# Patient Record
Sex: Male | Born: 1940
Health system: Southern US, Community
[De-identification: ages and names within clinical notes are randomized; demographics above are authoritative.]

---

## 1998-05-06 ENCOUNTER — Encounter: Admission: RE | Admit: 1998-05-06 | Discharge: 1998-05-06 | Payer: Self-pay | Admitting: Family Medicine

## 1998-06-03 ENCOUNTER — Ambulatory Visit: Admission: RE | Admit: 1998-06-03 | Discharge: 1998-06-03 | Payer: Self-pay | Admitting: Family Medicine

## 1998-06-06 ENCOUNTER — Encounter: Admission: RE | Admit: 1998-06-06 | Discharge: 1998-06-06 | Payer: Self-pay | Admitting: Family Medicine

## 1998-06-07 ENCOUNTER — Ambulatory Visit (HOSPITAL_COMMUNITY): Admission: RE | Admit: 1998-06-07 | Discharge: 1998-06-07 | Payer: Self-pay | Admitting: Family Medicine

## 2001-11-17 ENCOUNTER — Encounter: Admission: RE | Admit: 2001-11-17 | Discharge: 2001-11-17 | Payer: Self-pay | Admitting: Family Medicine

## 2001-12-14 ENCOUNTER — Encounter: Admission: RE | Admit: 2001-12-14 | Discharge: 2001-12-14 | Payer: Self-pay | Admitting: Family Medicine

## 2002-08-28 ENCOUNTER — Encounter: Admission: RE | Admit: 2002-08-28 | Discharge: 2002-08-28 | Payer: Self-pay | Admitting: Family Medicine

## 2002-09-11 ENCOUNTER — Encounter: Admission: RE | Admit: 2002-09-11 | Discharge: 2002-09-11 | Payer: Self-pay | Admitting: Family Medicine

## 2002-09-26 ENCOUNTER — Encounter: Admission: RE | Admit: 2002-09-26 | Discharge: 2002-09-26 | Payer: Self-pay | Admitting: Family Medicine

## 2003-09-20 ENCOUNTER — Ambulatory Visit (HOSPITAL_COMMUNITY): Admission: RE | Admit: 2003-09-20 | Discharge: 2003-09-21 | Payer: Self-pay | Admitting: Ophthalmology

## 2003-09-25 ENCOUNTER — Encounter: Admission: RE | Admit: 2003-09-25 | Discharge: 2003-09-25 | Payer: Self-pay | Admitting: Sports Medicine

## 2004-09-18 ENCOUNTER — Ambulatory Visit: Payer: Self-pay | Admitting: Family Medicine

## 2005-03-05 ENCOUNTER — Ambulatory Visit: Payer: Self-pay | Admitting: Family Medicine

## 2005-03-10 ENCOUNTER — Ambulatory Visit: Payer: Self-pay | Admitting: Family Medicine

## 2005-04-08 ENCOUNTER — Ambulatory Visit: Payer: Self-pay | Admitting: Family Medicine

## 2005-08-14 ENCOUNTER — Ambulatory Visit: Payer: Self-pay | Admitting: Family Medicine

## 2005-12-17 ENCOUNTER — Ambulatory Visit: Payer: Self-pay | Admitting: Family Medicine

## 2006-01-12 ENCOUNTER — Ambulatory Visit: Payer: Self-pay | Admitting: Family Medicine

## 2006-06-17 DIAGNOSIS — M109 Gout, unspecified: Secondary | ICD-10-CM

## 2006-06-17 DIAGNOSIS — J309 Allergic rhinitis, unspecified: Secondary | ICD-10-CM | POA: Insufficient documentation

## 2006-06-17 DIAGNOSIS — I10 Essential (primary) hypertension: Secondary | ICD-10-CM | POA: Insufficient documentation

## 2006-10-04 ENCOUNTER — Ambulatory Visit: Payer: Self-pay | Admitting: Family Medicine

## 2006-10-04 DIAGNOSIS — H4310 Vitreous hemorrhage, unspecified eye: Secondary | ICD-10-CM

## 2006-10-18 ENCOUNTER — Encounter (INDEPENDENT_AMBULATORY_CARE_PROVIDER_SITE_OTHER): Payer: Self-pay | Admitting: Family Medicine

## 2006-10-18 ENCOUNTER — Ambulatory Visit: Payer: Self-pay | Admitting: Sports Medicine

## 2006-10-18 LAB — CONVERTED CEMR LAB
BUN: 12 mg/dL (ref 6–23)
Calcium: 9.5 mg/dL (ref 8.4–10.5)
Creatinine, Ser: 0.96 mg/dL (ref 0.40–1.50)
HDL: 47 mg/dL (ref >39)
LDL Cholesterol: 98 mg/dL (ref 0–99)
Potassium: 4.5 meq/L (ref 3.5–5.3)

## 2006-10-19 ENCOUNTER — Encounter (INDEPENDENT_AMBULATORY_CARE_PROVIDER_SITE_OTHER): Payer: Self-pay | Admitting: Family Medicine

## 2007-08-22 ENCOUNTER — Encounter (INDEPENDENT_AMBULATORY_CARE_PROVIDER_SITE_OTHER): Payer: Self-pay | Admitting: Family Medicine

## 2007-08-22 ENCOUNTER — Ambulatory Visit: Payer: Self-pay | Admitting: Sports Medicine

## 2007-08-22 LAB — CONVERTED CEMR LAB
BUN: 13 mg/dL (ref 6–23)
Chloride: 103 meq/L (ref 96–112)
Glucose, Bld: 101 mg/dL — ABNORMAL HIGH (ref 70–99)
HDL: 48 mg/dL (ref >39)
LDL Cholesterol: 100 mg/dL — ABNORMAL HIGH (ref 0–99)
Potassium: 4.1 meq/L (ref 3.5–5.3)
Total CHOL/HDL Ratio: 3.7
Triglycerides: 152 mg/dL — ABNORMAL HIGH (ref <150)

## 2007-08-23 ENCOUNTER — Encounter (INDEPENDENT_AMBULATORY_CARE_PROVIDER_SITE_OTHER): Payer: Self-pay | Admitting: Family Medicine

## 2007-09-01 ENCOUNTER — Encounter (INDEPENDENT_AMBULATORY_CARE_PROVIDER_SITE_OTHER): Payer: Self-pay | Admitting: Family Medicine

## 2007-09-01 LAB — CONVERTED CEMR LAB
OCCULT 1: NEGATIVE
OCCULT 3: NEGATIVE

## 2008-08-20 ENCOUNTER — Encounter: Payer: Self-pay | Admitting: *Deleted

## 2008-09-27 ENCOUNTER — Ambulatory Visit: Payer: Self-pay | Admitting: Family Medicine

## 2008-09-27 ENCOUNTER — Encounter (INDEPENDENT_AMBULATORY_CARE_PROVIDER_SITE_OTHER): Payer: Self-pay | Admitting: Family Medicine

## 2008-09-27 DIAGNOSIS — J45909 Unspecified asthma, uncomplicated: Secondary | ICD-10-CM | POA: Insufficient documentation

## 2008-09-27 LAB — CONVERTED CEMR LAB
ALT: 17 units/L (ref 0–53)
Albumin: 4.3 g/dL (ref 3.5–5.2)
Cholesterol: 165 mg/dL (ref 0–200)
Glucose, Bld: 94 mg/dL (ref 70–99)
Sodium: 139 meq/L (ref 135–145)
Total CHOL/HDL Ratio: 3.7
Total Protein: 7.7 g/dL (ref 6.0–8.3)
Uric Acid, Serum: 9 mg/dL — ABNORMAL HIGH (ref 4.0–7.8)
VLDL: 28 mg/dL (ref 0–40)

## 2008-09-28 ENCOUNTER — Encounter (INDEPENDENT_AMBULATORY_CARE_PROVIDER_SITE_OTHER): Payer: Self-pay | Admitting: Family Medicine

## 2009-12-20 ENCOUNTER — Ambulatory Visit: Payer: Self-pay | Admitting: Family Medicine

## 2009-12-20 ENCOUNTER — Encounter: Payer: Self-pay | Admitting: Family Medicine

## 2009-12-20 LAB — CONVERTED CEMR LAB
ALT: 17 units/L (ref 0–53)
Albumin: 4.3 g/dL (ref 3.5–5.2)
Alkaline Phosphatase: 54 units/L (ref 39–117)
CO2: 28 meq/L (ref 19–32)
Chloride: 102 meq/L (ref 96–112)
Cholesterol: 168 mg/dL (ref 0–200)
Creatinine, Ser: 0.96 mg/dL (ref 0.40–1.50)
Glucose, Bld: 105 mg/dL — ABNORMAL HIGH (ref 70–99)
RBC: 5.19 M/uL (ref 4.22–5.81)
Total Bilirubin: 1.2 mg/dL (ref 0.3–1.2)
Total Protein: 7.5 g/dL (ref 6.0–8.3)
Triglycerides: 156 mg/dL — ABNORMAL HIGH (ref <150)

## 2010-05-20 NOTE — Assessment & Plan Note (Signed)
Summary: cpe,df   Vital Signs:  Patient profile:   70 year old male Weight:      138.5 pounds Temp:     98.5 degrees F oral Pulse rate:   86 / minute Pulse rhythm:   regular BP sitting:   155 / 101  (left arm) Cuff size:   regular  Vitals Entered By: Loralee Pacas CMA (December 20, 2009 8:36 AM)                                                                                                                                                                                                                                                                                                                                                                                                                                                                                                                                  Serial Vital Signs/Assessments:  Time      Position  BP  Pulse  Resp  Temp     By                     124/84                         Renold Don MD  CC: cpe Comments pt stated that he was nervous and believes thats why his bp was up    Primary Care Provider:  Sylvan Cheese MD  CC:  cpe.  History of Present Illness: 1.  HTN- taking lisinopril-HCTZ on stable dose now for several years. Check blood pressure at home daily, mostly in 110-120s, very occasional 130 or 140 measurement on days when he does not take medication.  Denies medication side effects.  No CP or leg swelling.  Has  occasional shortness of breath-120s which he attributes to asthma (see below).  Not taking ASA regularly.    2.  "asthma"- has h/o normal PFTs in 2004, no formal documentation of asthma in the past.  Uses ventolin inhaler mostly during allergy season in the fall.  During allergy season he uses inhaler regularly, but currently not having to use inhaler at all.  Former smoker but quit over 30 years ago.    3.  gout- has not had episode recently.  Usually happens only once a year.  Takes advil as needed for pain but infrequently.  Had uric acid level drawn and slightly elevated before.    4.  colon cancer screening- declines colonoscopy   Habits & Providers  Alcohol-Tobacco-Diet     Tobacco Status: quit     Year Quit: 1970  Current Problems (verified): 1)  Asthma, Intermittent, Mild  (ICD-493.90) 2)  Physical Examination  (ICD-V70.0) 3)  Hx of Hemorrhage, Vitreous  (ICD-379.23) 4)  Rhinitis, Allergic  (ICD-477.9) 5)  Hypertension, Benign Systemic  (ICD-401.1) 6)  Gout, Unspecified  (ICD-274.9)  Current Medications (verified): 1)  Albuterol 90 Mcg/act Aers (Albuterol) .... Inhale 1 Puff Every Four To Six Hours 2)  Aspirin 81 Mg Tbec (Aspirin) .... Take 1 Tablet By Mouth Once A Day 3)  Colchicine 0.6 Mg Tabs (Colchicine) .... Take 1 Tablet By Mouth Every Four Hours 4)  Zestoretic 20-25 Mg Tabs (Lisinopril-Hydrochlorothiazide) .... Take 1 Tablet By Mouth Once A Day  Allergies (verified): No Known Drug Allergies  Social History: Smoking Status:  quit  Review of Systems       ROS:  no headaches, pre-syncopal or syncopal episodes, chest pain, palpitations abdominal pain, diarrhea or constipation, melena, hematochezia, lower extremity swelling.    Physical Exam  General:  Vital signs reviewed.  Repeat as above Well-developed, well-nourished patient in NAD.  Awake and cooperative  Head:  normocephalic and atraumatic.   Eyes:  wears glassesp, upils equal, pupils round, and pupils  reactive to light.   Mouth:  Oral mucosa pink and moist Dentition:  dentures Neck:  supple, no thyromegaly, no carotid bruits, and no cervical lymphadenopathy.   Lungs:  clear to auscultation bilaterally without wheezing, rales, or rhonchi.  Normal work of breathing  Heart:  Regular rate and rhythm without murmur, rub, or gallop.  Normal S1/S2  Abdomen:  soft/nondistended/nontender.  Bowel sounds present and normoactive.  No organomegaly noted.   Extremities:  No clubbing, cyanosis, edema, or deformity noted with normal full range of motion of all joints.   Neurologic:  alert & oriented X3 and cranial nerves II-XII intact.   No focal deficits   Impression &  Recommendations:  Problem # 1:  HYPERTENSION, BENIGN SYSTEMIC (ICD-401.1) Assessment Unchanged Currently at goal.  Continue on current meds.   His updated medication list for this problem includes:    Zestoretic 20-25 Mg Tabs (Lisinopril-hydrochlorothiazide) .Marland Kitchen... Take 1 tablet by mouth once a day  Orders: Comp Met-[facility] ([id number]) Lipid-[facility] ([id number])  Problem # 2:  ASTHMA, INTERMITTENT, MILD (ICD-493.90) Will refill.  As noted above, unclear etiology for shortness of breath, PFTs normal in 2004.   His updated medication list for this problem includes:    Albuterol 90 Mcg/act Aers (Albuterol) ..... Inhale 1 puff every four to six hours  Orders: [facility]- Est  Level 4 (01093)  Problem # 3:  GOUT, UNSPECIFIED (ICD-274.9) Assessment: Improved Only about 1 attack per year.  NSAIDs as needed.  Will follow. His updated medication list for this problem includes:    Colchicine 0.6 Mg Tabs (Colchicine) .Marland Kitchen... Take 1 tablet by mouth every four hours  Complete Medication List: 1)  Albuterol 90 Mcg/act Aers (Albuterol) .... Inhale 1 puff every four to six hours 2)  Aspirin 81 Mg Tbec (Aspirin) .... Take 1 tablet by mouth once a day 3)  Colchicine 0.6 Mg Tabs (Colchicine) .... Take 1 tablet by mouth every four hours 4)  Zestoretic  20-25 Mg Tabs (Lisinopril-hydrochlorothiazide) .... Take 1 tablet by mouth once a day  Other Orders: CBC-[facility] (23557)  Patient Instructions: 1)  Your blood pressure is a little high today. 2)  Labs done today- cholesterol, uric acid, and electrolyte panel.   3)  Follow up with your new doctor in 1 year or sooner if you have any problems. 4)  Refills have been sent to your pharmacy. Prescriptions: ZESTORETIC 20-25 MG TABS (LISINOPRIL-HYDROCHLOROTHIAZIDE) Take 1 tablet by mouth once a day  #90 x 3   Entered and Authorized by:   Renold Don MD   Signed by:   Renold Don MD on 12/20/2009   Method used:   Electronically to        [facility] (retail)       [address]       [place], [place]  [address]       Ph: [phone]       Fax: [phone]   RxID:   [id number] ALBUTEROL 90 MCG/ACT AERS (ALBUTEROL) Inhale 1 puff every four to six hours  #1 x 6   Entered and Authorized by:   Renold Don MD   Signed by:   Renold Don MD on 12/20/2009   Method used:   Electronically to        [facility] (retail)       [address]       [place], [place]  [address]       Ph: [phone]       Fax: [phone]   RxID:   [id number]

## 2010-05-30 ENCOUNTER — Encounter: Payer: Self-pay | Admitting: *Deleted

## 2010-05-30 ENCOUNTER — Observation Stay (HOSPITAL_COMMUNITY): Payer: PRIVATE HEALTH INSURANCE

## 2010-05-30 ENCOUNTER — Inpatient Hospital Stay (HOSPITAL_COMMUNITY)
Admission: AD | Admit: 2010-05-30 | Discharge: 2010-05-31 | DRG: 310 | Disposition: A | Discharge status: Home / Self Care | Payer: PRIVATE HEALTH INSURANCE | Source: Ambulatory Visit | Attending: Family Medicine | Admitting: Family Medicine

## 2010-05-30 DIAGNOSIS — R5381 Other malaise: Secondary | ICD-10-CM | POA: Diagnosis present

## 2010-05-30 DIAGNOSIS — R002 Palpitations: Secondary | ICD-10-CM | POA: Diagnosis present

## 2010-05-30 DIAGNOSIS — M109 Gout, unspecified: Secondary | ICD-10-CM | POA: Diagnosis present

## 2010-05-30 DIAGNOSIS — I10 Essential (primary) hypertension: Secondary | ICD-10-CM

## 2010-05-30 DIAGNOSIS — R0602 Shortness of breath: Secondary | ICD-10-CM

## 2010-05-30 DIAGNOSIS — Z7982 Long term (current) use of aspirin: Secondary | ICD-10-CM

## 2010-05-30 DIAGNOSIS — M19019 Primary osteoarthritis, unspecified shoulder: Secondary | ICD-10-CM | POA: Diagnosis present

## 2010-05-30 DIAGNOSIS — Z23 Encounter for immunization: Secondary | ICD-10-CM

## 2010-05-30 DIAGNOSIS — B9789 Other viral agents as the cause of diseases classified elsewhere: Secondary | ICD-10-CM

## 2010-05-30 DIAGNOSIS — I491 Atrial premature depolarization: Principal | ICD-10-CM | POA: Diagnosis present

## 2010-05-30 DIAGNOSIS — J42 Unspecified chronic bronchitis: Secondary | ICD-10-CM

## 2010-05-30 DIAGNOSIS — J449 Chronic obstructive pulmonary disease, unspecified: Secondary | ICD-10-CM | POA: Diagnosis present

## 2010-05-30 DIAGNOSIS — Z87891 Personal history of nicotine dependence: Secondary | ICD-10-CM

## 2010-05-30 DIAGNOSIS — J4489 Other specified chronic obstructive pulmonary disease: Secondary | ICD-10-CM | POA: Diagnosis present

## 2010-05-30 LAB — CARDIAC PANEL(CRET KIN+CKTOT+MB+TROPI)
Relative Index: 2.4 (ref 0.0–2.5)
Troponin I: 0.02 ng/mL (ref 0.00–0.06)

## 2010-05-31 ENCOUNTER — Observation Stay (HOSPITAL_COMMUNITY): Payer: PRIVATE HEALTH INSURANCE

## 2010-05-31 LAB — CARDIAC PANEL(CRET KIN+CKTOT+MB+TROPI)
Relative Index: 2.3 (ref 0.0–2.5)
Relative Index: 2.6 — ABNORMAL HIGH (ref 0.0–2.5)
Total CK: 131 U/L (ref 7–232)
Troponin I: 0.01 ng/mL (ref 0.00–0.06)

## 2010-05-31 LAB — DIFFERENTIAL
Lymphs Abs: 2.4 10*3/uL (ref 0.7–4.0)
Monocytes Absolute: 0.8 10*3/uL (ref 0.1–1.0)
Neutrophils Relative %: 48 % (ref 43–77)

## 2010-05-31 LAB — COMPREHENSIVE METABOLIC PANEL
ALT: 19 U/L (ref 0–53)
Albumin: 3.5 g/dL (ref 3.5–5.2)
CO2: 26 mEq/L (ref 19–32)
Creatinine, Ser: 0.93 mg/dL (ref 0.4–1.5)
GFR calc Af Amer: >60 mL/min (ref >60)
Potassium: 3.5 mEq/L (ref 3.5–5.1)
Sodium: 141 mEq/L (ref 135–145)
Total Protein: 6.8 g/dL (ref 6.0–8.3)

## 2010-05-31 LAB — LIPID PANEL
HDL: 34 mg/dL — ABNORMAL LOW (ref >39)
LDL Cholesterol: 94 mg/dL (ref 0–99)
Total CHOL/HDL Ratio: 4.7 RATIO
VLDL: 33 mg/dL (ref 0–40)

## 2010-05-31 LAB — CBC
Hemoglobin: 14.4 g/dL (ref 13.0–17.0)
MCH: 30.2 pg (ref 26.0–34.0)
RBC: 4.77 MIL/uL (ref 4.22–5.81)
RDW: 12.7 % (ref 11.5–15.5)

## 2010-05-31 MED ORDER — TECHNETIUM TC 99M TETROFOSMIN IV KIT
30.0000 | PACK | Freq: Once | INTRAVENOUS | Status: AC | PRN
  Administered 2010-05-31: 30 via INTRAVENOUS

## 2010-05-31 MED ORDER — TECHNETIUM TC 99M TETROFOSMIN IV KIT
10.0000 | PACK | Freq: Once | INTRAVENOUS | Status: AC | PRN
  Administered 2010-05-31: 10 via INTRAVENOUS

## 2010-06-01 ENCOUNTER — Other Ambulatory Visit (HOSPITAL_COMMUNITY): Payer: PRIVATE HEALTH INSURANCE

## 2010-06-03 NOTE — Consult Note (Signed)
[name], [name]                   ACCOUNT NO.:  [id number]  MEDICAL RECORD NO.:  [id number]           PATIENT TYPE:  I  LOCATION:  3743                         FACILITY:  [facility]  PHYSICIAN:  Armanda Magic, M.D.     DATE OF BIRTH:  [birth year]  DATE OF CONSULTATION:  05/30/2010 DATE OF DISCHARGE:                                CONSULTATION   REFERRING PHYSICIAN:  Dr. Denny Levy.  CHIEF COMPLAINT:  Chest tightness.  HISTORY OF PRESENT ILLNESS:  This is a 70 year old male with known history of hypertension and chronic bronchitis, reported 2-day history of chest tightness accompanied by palpitations.  He says that this past episode started this morning.  Apparently he was lifting a child into a car seat and developed chest pressure in his chest with no radiation and no associated symptoms of shortness of breath, nausea, vomiting or diaphoresis.  He says it last about 13 minutes and resolved.  He does admit to using his albuterol inhaler three times yesterday and one time today with mild relief of chest tightness.  Currently denies any chest pain.  His daughter who is a physician evaluated him at home this morning and felt that his heart rate was abnormal and she took him to the clinic where an EKG shows sinus tachycardia with blocked PACs.  We are now asked to consult.  PAST MEDICAL HISTORY:  Hypertension, chronic bronchitis, gout, and right shoulder arthritis.  ALLERGIES:  None.  MEDICATIONS: 1. Albuterol inhaler 1 puff q.4 h. p.r.n. 2. Lisinopril/HCTZ 20/25 mg daily. 3. Advil p.r.n. 4. Vitamin D. 5. Vitamin E. 6. Calcium supplement.  SOCIAL HISTORY:  He lives at home with his wife.  He is unemployed.  He has a Ph.D. in physics.  He drinks two beers a week.  He use to smoke but quit.  He did not do any regular exercise.  FAMILY HISTORY:  There is no family history of CAD.  REVIEW OF SYSTEMS:  Significant for fatigue for 3-4 days and cough.  PHYSICAL EXAMINATION:  VITAL  SIGNS:  Blood pressure was 112/77, heart rate 112, respirations 20, and O2 saturation 97% on room air. GENERAL:  Well-developed, well-nourished white Asian man in no acute distress. HEENT:  Benign. NECK:  Supple without lymphadenopathy.  Carotid upstrokes are +2 without bruits. LUNGS:  Clear to auscultation throughout. HEART:  Regular rate and rhythm.  No murmurs, rubs, or gallops.  Normal S1 and S2. ABDOMEN:  Soft, nontender, nondistended.  Positive bowel sounds.  No hepatosplenomegaly. EXTREMITIES:  No cyanosis, erythema, or edema.  EKG on admission showed sinus rhythm with frequent PACs and short runs of atrial tachycardia, heart rate 110 beats per minute,  there are no ST- T wave abnormalities.  Labs are pending.  ASSESSMENT: 1. Chest pressure of unclear etiology is cardiac risk factors include     history of hypertension, there is no family history of coronary     artery disease.  EKG is nonischemic. 2. Frequent PACs and short runs of nonsustained atrial tachycardia up     to 5 beats.  Otherwise EKG nonischemic.  The patient  does complain     of a history of palpitations which is most likely related to     underlying PACs. 3. History of chronic bronchitis. 4. History of hypertension.  PLAN: 1. Would start Cardizem 120 mg daily for suppression of PACs.  We will     hold on beta blockers given his history of chronic bronchitis. 2. N.p.o. after midnight. 3. Stress Cardiolite study in the morning to evaluate for underlying     coronary ischemia.     Armanda Magic, M.D.     TT/MEDQ  D:  05/30/2010  T:  05/31/2010  Job:  [id number]  Electronically Signed by Armanda Magic M.D. on 06/03/2010 12:08:57 PM

## 2010-06-17 NOTE — H&P (Signed)
[name], [name]                   ACCOUNT NO.:  [id number]  MEDICAL RECORD NO.:  [id number]           PATIENT TYPE:  I  LOCATION:  3743                         FACILITY:  [facility]  PHYSICIAN:  Fred Ramp, MD        DATE OF BIRTH:  22-Apr-1940  DATE OF ADMISSION:  05/30/2010 DATE OF DISCHARGE:                             HISTORY & PHYSICAL   PRIMARY CARE PHYSICIAN:  Fred Don, MD at [facility]  CHIEF COMPLAINT:  Chest tightness.  HISTORY OF PRESENT ILLNESS:  The patient is a 70 year old male with a known history of hypertension and chronic bronchitis who reports a 2-day history of chest tightness accompanied by palpitations that started this morning.  He admits to using his albuterol inhaler 3 times yesterday and one times this morning with mild relief of tightness.  He denies chest pain, dyspnea on exertion, syncope, nausea, or vomiting, tingling or numbness in extremities and sweating.  His daughter who is a physician evaluated him at home this morning and felt that his heart rate was abnormal/irregular.  She took him to [facility] where an EKG was obtained that showed sinus tachycardia and evidence of premature atrial complexes concerning for either heart block or atrial flutter.  Blood pressure at that time was 110/70.  ALLERGIES:  The patient has no known drug allergies.  PAST MEDICAL HISTORY:  Significant for hypertension controlled, chronic bronchitis, gout, right shoulder arthritis.  MEDICATIONS:  The patient takes albuterol inhaler 1 puff q.4 p.r.n., lisinopril/HCTZ combination 20/25 mg p.o. daily, Advil p.r.n., vitamin D, vitamin E, and calcium supplements.  SOCIAL HISTORY:  The patient lives at home with his wife.  He is unemployed.  He has a Ph.D. in physics.  He drinks about 2 beers per week.  He is a previous smoker who quit in his 68s.  He denies illicit drug use.  He denies regular exercise.  REVIEW OF SYSTEMS:   Pertinent positives include fatigue x4 days accompanied by dry cough.  Pertinent negatives include sneezing fevers, night sweats, weight changes, nausea, vomiting, diarrhea, constipation, and dysuria.  PHYSICAL EXAMINATION:  VITALS:  Temperature 98.5, blood pressure 112/77, heart rate 112, O2 sats 97% on room air. GENERAL:  He is awake, alert in no acute distress.  He is a well groomed elderly male. HEENT:  His head is normocephalic, atraumatic. NECK:  Supple and nontender. CARDIOVASCULAR:  S1, S2, tachy rate, no murmurs, rubs or gallops.  No carotid bruits on auscultation bilaterally. LUNGS:  Normal work of breathing.  Clear to auscultation bilaterally. No wheezing, rales, or rhonchi. ABDOMEN:  Soft, nontender, and nondistended. EXTREMITIES:  No edema and nontender.  LABS AND STUDIES:  EKG obtained May 30, 2010 in clinic, sinus tachycardia, vent rate 110 with blocked premature atrial complexes.  ASSESSMENT AND PLAN:  A 70 year old male with new onset chest tightness and EKG changes concerning for heart block versus atrial flutter. 1. Chest tightness likely cardiac in etiology in the setting of     arrhythmia and normal lung exam.  The plan is to admit to  [facility]     [facility] tele floor, cycle cardiac enzymes q.8     times 3 sets, obtain a chest x-ray, repeat EKG, obtain TSH level,     and call a cardiology consult.  We will also start the patient on     aspirin 81 mg p.o. daily, Zofran 4 mg p.o. q.6 as needed for nausea     and Tylenol 650 mg p.o. q.6 as needed for pain. 2. History of chronic bronchitis.  The patient was is tachy which is     likely in part secondary to increased albuterol use.  Normal lung     exam, so we will hold albuterol for now, supplemental oxygen as     needed. 3. History of hypertension, blood pressure normal.  We will continue     home blood pressure medications. 4. Fluids, electrolytes, nutrition/gastrointestinal.  Regular diet,      BMET, Hep-Lock IV. 5. Prophylaxis.  Heparin 5000 units subcu t.i.d. for deep venous     thrombosis prophylaxis.    ______________________________ Dessa Phi, MD   ______________________________ Fred Ramp, MD    JF/MEDQ  D:  05/30/2010  T:  05/31/2010  Job:  [id number]  cc:   Fred Don, MD  Electronically Signed by Dessa Phi MD on 06/08/2010 11:17:55 AM Electronically Signed by Denny Levy MD on 06/17/2010 04:19:12 PM

## 2010-08-08 ENCOUNTER — Encounter: Payer: Self-pay | Admitting: Family Medicine

## 2010-08-08 ENCOUNTER — Ambulatory Visit (INDEPENDENT_AMBULATORY_CARE_PROVIDER_SITE_OTHER): Payer: PRIVATE HEALTH INSURANCE | Admitting: Family Medicine

## 2010-08-08 DIAGNOSIS — I10 Essential (primary) hypertension: Secondary | ICD-10-CM

## 2010-08-08 DIAGNOSIS — I471 Supraventricular tachycardia, unspecified: Secondary | ICD-10-CM

## 2010-08-08 DIAGNOSIS — J45909 Unspecified asthma, uncomplicated: Secondary | ICD-10-CM

## 2010-08-08 DIAGNOSIS — M109 Gout, unspecified: Secondary | ICD-10-CM

## 2010-08-08 DIAGNOSIS — I4719 Other supraventricular tachycardia: Secondary | ICD-10-CM | POA: Insufficient documentation

## 2010-08-08 MED ORDER — FLUTICASONE-SALMETEROL 100-50 MCG/DOSE IN AEPB: 1.0000 | INHALATION_SPRAY | Freq: Two times a day (BID) | RESPIRATORY_TRACT | Status: DC

## 2010-08-08 NOTE — Progress Notes (Signed)
  Subjective:    Patient ID: [name], male    DOB: 01-25-1941, 70 y.o.   MRN: [id number]  HPI  #1: Gout flare. He says he has these a couple times a year. This one is not very bad, his pain is being alleviated by over-the-counter Motrin. He takes one or 2 pills once or twice a day. The pain is in his right great toe. His main concern is if his gout is destroying the joint. He doesn't really want to take something stronger at this time.  #2: Asthma: He is using his albuterol inhaler 4 times a day right now. His daughter is with him and she wonders if he would be a candidate for going on a long acting inhaler. He would be agreeable to that.  #3: Arrhythmia: He is only taking his medicine once every 2-3 days. He said he's had no episodes of heart palpitations. He denies any chest pains. He denies any shortness of breath, no change in his work capacity. No lower extremity edema. No headache. He says he doesn't want to take any medicines daily that he doesn't have to.  #4 hypertension: He has questions about reinitiating should not take his blood pressure pill. He takes his blood pressure at home daily and thinks that if his pressure is 110 systolic it might not be a good idea to take his medicine because it might drop too low. He is not having any episodes of dizziness or lightheadedness.  Review of Systems See history of present illness.    Objective:   Physical Exam  Constitutional: He appears well-developed.  Cardiovascular: Normal rate, regular rhythm and normal heart sounds.   Musculoskeletal:       Right great toe has some external changes of long-standing gout. There is mild erythema at the first MTP. It is actually not very tender to palpation or soft touch at this time. He has good pulses bilaterally . He does not have a lot of callus formation on his feet and there are no lesions. He has normal sensation.          Assessment & Plan:  #1 gout: I would continue his current  regimen of intermittent occasional NSAIDs that are over-the-counter. If he has a serious flare he will call me. #2: Hypertension: Long discussion regarding best usage of medicine which would be daily. I think we get this straightened out her on the same page. #3: Arrhythmia we discussed this. I think he will take his medicine like he wants to. At this time he seems to be doing well so I don't think we need to change much. Her back to see him back in 3 months sooner if problems. His daughter Dr.  Ronalee Red was present with him today. #4 intermittent asthma: I. will put him on Advair 100/50 and see how he does. I would like to decrease his albuterol usage some area in general he is doing well. He is amenable to this.

## 2010-08-21 NOTE — Discharge Summary (Signed)
  [name], [name]                   ACCOUNT NO.:  [id number]  MEDICAL RECORD NO.:  [id number]           PATIENT TYPE:  I  LOCATION:  3743                         FACILITY:  [facility]  PHYSICIAN:  Nestor Ramp, MD        DATE OF BIRTH:  [birth year]  DATE OF ADMISSION:  05/30/2010 DATE OF DISCHARGE:  05/31/2010                              DISCHARGE SUMMARY   ADMITTING DIAGNOSES: 1. Chest tightness. 2. Hypertension. 3. Chronic bronchitis.  DISCHARGE DIAGNOSES: 1. Chest pain, no ischemic changes. 2. Hypertension. 3. Chronic bronchitis.  DISCHARGE MEDICATIONS: 1. Lisinopril/HCTZ 20/25 mg 1 tablet by mouth daily. 2. Ventolin/albuterol 1-2 puffs inhaled q.4 h. p.r.n. 3. Aspirin 81 mg p.o. daily. 4. Diltiazem 120 mg p.o. daily.  BRIEF HOSPITAL SUMMARY:  The patient is a 70 year old male with a known history of hypertension and chronic bronchitis, reported a 2-day history of chest tightness along with palpitations that started on the morning of February 10.  He describes the discomfort as chest tightness.  He denies chest pain, dyspnea, syncope, or other associated symptoms.  He was admitted to the hospital for [facility] after EKG showed sinus tachycardia and PACs.  They were concerning for either heart block or atrial flutter.  The patient was admitted to the hospital, evaluated by Cardiology.  After evaluation, Cardiology felt that EKG was showing more PACs and this may be the cause of the chest tightness and symptoms of palpitations.  The patient was started on Cardizem 120 mg daily.  Cardiac enzymes cycled x3, all within normal limits.  The patient was sent for stress Myoview which showed was negative for stress-induced ischemia and showed left ventricular ejection fraction of 74%.  The patient endorsed improvement of symptoms overnight during hospital stay.  The patient was discharged from hospital on May 31, 2010 with strict follow-up with Dr. Jennette Kettle at [facility] in the upcoming week.  The patient to be discharged home on diltiazem 120 mg p.o. daily.  DISCHARGE CONDITION:  Stable.  FOLLOWUP:  Follow up with Dr. Jennette Kettle at [facility] in the upcoming week.     Ellin Mayhew, MD   ______________________________ Nestor Ramp, MD    DC/MEDQ  D:  05/31/2010  T:  06/01/2010  Job:  [id number]  Electronically Signed by Ellin Mayhew  on 08/05/2010 02:16:09 PM Electronically Signed by Denny Levy MD on 08/21/2010 09:29:06 AM

## 2010-08-25 ENCOUNTER — Encounter: Payer: Self-pay | Admitting: Home Health Services

## 2010-09-05 NOTE — Op Note (Signed)
NAME:  [name], [name]                         ACCOUNT NO.:  [id number]   MEDICAL RECORD NO.:  [id number]                   PATIENT TYPE:  OIB   LOCATION:  2899                                 FACILITY:  [facility]   PHYSICIAN:  Beulah Gandy. Ashley Royalty, M.D.              DATE OF BIRTH:  28-Aug-1940   DATE OF PROCEDURE:  09/20/2003  DATE OF DISCHARGE:                                 OPERATIVE REPORT   ADMISSION DIAGNOSIS:  Vitreous hemorrhage.   POSTOPERATIVE DIAGNOSIS:  Vitreous hemorrhage and branch retinal vein  occlusion x 2, right eye.   PROCEDURES:  1. Pars plana vitrectomy, right eye.  2. Retinal photocoagulation, right eye.  3. Membrane peel, right eye.   SURGEON:  Beulah Gandy. Ashley Royalty, M.D.   ASSISTANT:  Bryan Lemma. Lundquist, P.A.   ANESTHESIA:  General.   DETAILS:  Usual prep and drape.  Peritomies at 8 o'clock, 10 o'clock, and 2  o'clock.  The 4-mm angled infusion cannula was anchored into place at 8  o'clock.  The lighted pick and the cutter were placed at 10 o'clock and 2  o'clock, respectively.  Provisc was placed on the corneal surface and the  biome was moved into place.  The pars plana vitrectomy was begun just behind  the cataractous lens.  White material was encountered and carefully removed  under low suction and rapid cutting.  Dense areas and sparse areas of white  material were encountered.  These were carefully removed under low suction  and rapid cutting.  Some dark cranberry-colored dense areas were seen and  thought to be old blood.  These were also carefully removed under low  suction and rapid cutting.  Once the view finally cleared and all of the  white material was removed, it became apparent that there was a branch vein  occlusion with membrane along the upper arcade in the upper retina.  The  lighted pick and the cutter were drawn down to the macular surface and this  membrane was peeled free and removed with the cutter.  The endolaser was  positioned in the  eye and 1022 burns were placed around the neovascular area  of the branch vein occlusion in the right eye.  Power was 1000 microns each,  diameter 1000 microns, and duration 0.1 seconds each.  A washout procedure  was performed with the [employer] [facility] brush until the media was totally  clear.  The instruments were then removed from the eye and 9-0 nylon was  used to close the sclerotomy sites.  The conjunctiva was closed with wet-  field cautery.  Polymyxin and gentamycin were irrigated to [place] space and  atropine solution was applied.  Marcaine was injected around the globe for  postoperative pain.  Decadron 10 mg was injected into the lower conjunctival  space.  Polysporin and a patch and shield were placed.  The patient was  awakened and taken to recovery in  satisfactory condition.  Closing tension  was 10 with a [facility].   COMPLICATIONS:  None.   DURATION:  One hour.                                               Beulah Gandy. Ashley Royalty, M.D.    JDM/MEDQ  D:  09/20/2003  T:  09/20/2003  Job:  [id number]

## 2011-06-08 ENCOUNTER — Other Ambulatory Visit: Payer: PRIVATE HEALTH INSURANCE

## 2011-06-08 ENCOUNTER — Encounter: Payer: Self-pay | Admitting: Family Medicine

## 2011-06-08 ENCOUNTER — Ambulatory Visit (INDEPENDENT_AMBULATORY_CARE_PROVIDER_SITE_OTHER): Payer: PRIVATE HEALTH INSURANCE | Admitting: Family Medicine

## 2011-06-08 VITALS — BP 120/70 | Ht 63.0 in | Wt 162.0 lb

## 2011-06-08 DIAGNOSIS — I10 Essential (primary) hypertension: Secondary | ICD-10-CM

## 2011-06-08 DIAGNOSIS — I471 Supraventricular tachycardia: Secondary | ICD-10-CM

## 2011-06-08 DIAGNOSIS — I4719 Other supraventricular tachycardia: Secondary | ICD-10-CM

## 2011-06-08 DIAGNOSIS — M109 Gout, unspecified: Secondary | ICD-10-CM

## 2011-06-08 LAB — LDL CHOLESTEROL, DIRECT: Direct LDL: 112 mg/dL — ABNORMAL HIGH

## 2011-06-08 MED ORDER — LISINOPRIL 20 MG PO TABS: 20.0000 mg | ORAL_TABLET | Freq: Every day | ORAL | Status: DC

## 2011-06-08 NOTE — Progress Notes (Signed)
Cmp,direct ldl and uric acid done today [name]

## 2011-06-09 ENCOUNTER — Encounter: Payer: Self-pay | Admitting: Family Medicine

## 2011-06-09 LAB — COMPREHENSIVE METABOLIC PANEL
Alkaline Phosphatase: 57 U/L (ref 39–117)
BUN: 12 mg/dL (ref 6–23)
Calcium: 9.4 mg/dL (ref 8.4–10.5)
Creat: 0.83 mg/dL (ref 0.50–1.35)
Sodium: 142 mEq/L (ref 135–145)
Total Bilirubin: 0.5 mg/dL (ref 0.3–1.2)
Total Protein: 7.5 g/dL (ref 6.0–8.3)

## 2011-06-09 NOTE — Progress Notes (Signed)
  Subjective:    Patient ID: [name], male    DOB: 06/09/1940, 71 y.o.   MRN: [id number]  HPI  Has questions about his gout related to his blood pressure medicine. He reports getting gout attacks the last several weeks each spring and fall. His daughter who is with him today shows me a picture of the last gout attack that reveals a very large red ankle. He has had gout attacks in his bilateral ankles, bilateral great toes. He's not had any issues with his knees hips or wrists.  Regarding his medications, he is not taking the diltiazem on a regular basis. He says if he has a little fluttering in his heart he'll take it that day but most days he does not. He averages taking the pill 2-3 times a month. He is able to do what he wants without any decrease in activity level or exercise tolerance. He's had no shortness of breath, no return of the symptoms that he had prior to his hospitalization last year. He's had no lower extremity swelling.  Has some questions regarding preventive medicine.  Review of Systems Denies fever, sweats, chills. Has had no unusual weight change. Appetite is normal as is his activity level. He is sleeping well. He denies headaches , denies visual changes.    Objective:   Physical Exam  Vital signs reviewed. GENERAL: Well developed, well nourished, no acute distress ANKLES: Bilaterally normal without any swelling, no effusion, no erythema. Cardiovascular: Regular rate and rhythm.      Assessment & Plan:  #1. Gouty arthropathy: I will check uric acid level. I think it's prudent to change his blood pressure medicine,  getting rid of the HCTZ portion. He checks his  pressures at home and he will let me knowhow he's doing on just the lisinopril. #2. History of atrial tachycardia that is paroxysmal. He seems to be doing extremely well right now. It's unknown whether or not he is returning occasionally into atrial fibrillation. He does not want to pursue any additional  levaluation or  treatment. #3. Preventive medicine. I did discuss with him today the shingles vaccine which he does not currently want. I recommended colonoscopy and we discussed at some length but he does not want to consider that right now. He is updated on his tetanus shot. I will get an LDL cholesterol today, check kidney function. #4. Family history of thoracic aortic aneurysm in his brother and his brother was 79 years of age. We had discussed screening ultrasound for abdominal aortic aneurysm which he does want to pursue. He also wants to be evaluated for potential thoracic aortic aneurysm. I'll have to do some checking and see what the best way to combine those 2 evaluations will be  and I will get back to him or to his daughter. I will also check with his daughter regarding whether or not he is taking his aspirin.

## 2011-06-29 ENCOUNTER — Other Ambulatory Visit: Payer: Self-pay | Admitting: Family Medicine

## 2011-06-29 MED ORDER — AMLODIPINE BESYLATE 5 MG PO TABS: 5.0000 mg | ORAL_TABLET | Freq: Every day | ORAL | Status: DC

## 2011-06-29 NOTE — Progress Notes (Signed)
BP on the lisinopril 40 is 130-155 systolic. Discussed and we will return to 20 of lisinopril and start 5 amlodipine. [name]

## 2011-09-07 ENCOUNTER — Other Ambulatory Visit: Payer: Self-pay | Admitting: Family Medicine

## 2011-09-07 MED ORDER — FLUTICASONE-SALMETEROL 100-50 MCG/DOSE IN AEPB: 1.0000 | INHALATION_SPRAY | Freq: Two times a day (BID) | RESPIRATORY_TRACT | Status: DC

## 2011-10-29 IMAGING — CR DG CHEST 2V
2 series · 2 of 2 positions shown · non-contrast
Comparison: 09/20/2003

CLINICAL DATA: Shortness of breath.  Weakness.

CHEST - 2 VIEW

[w chest pa]
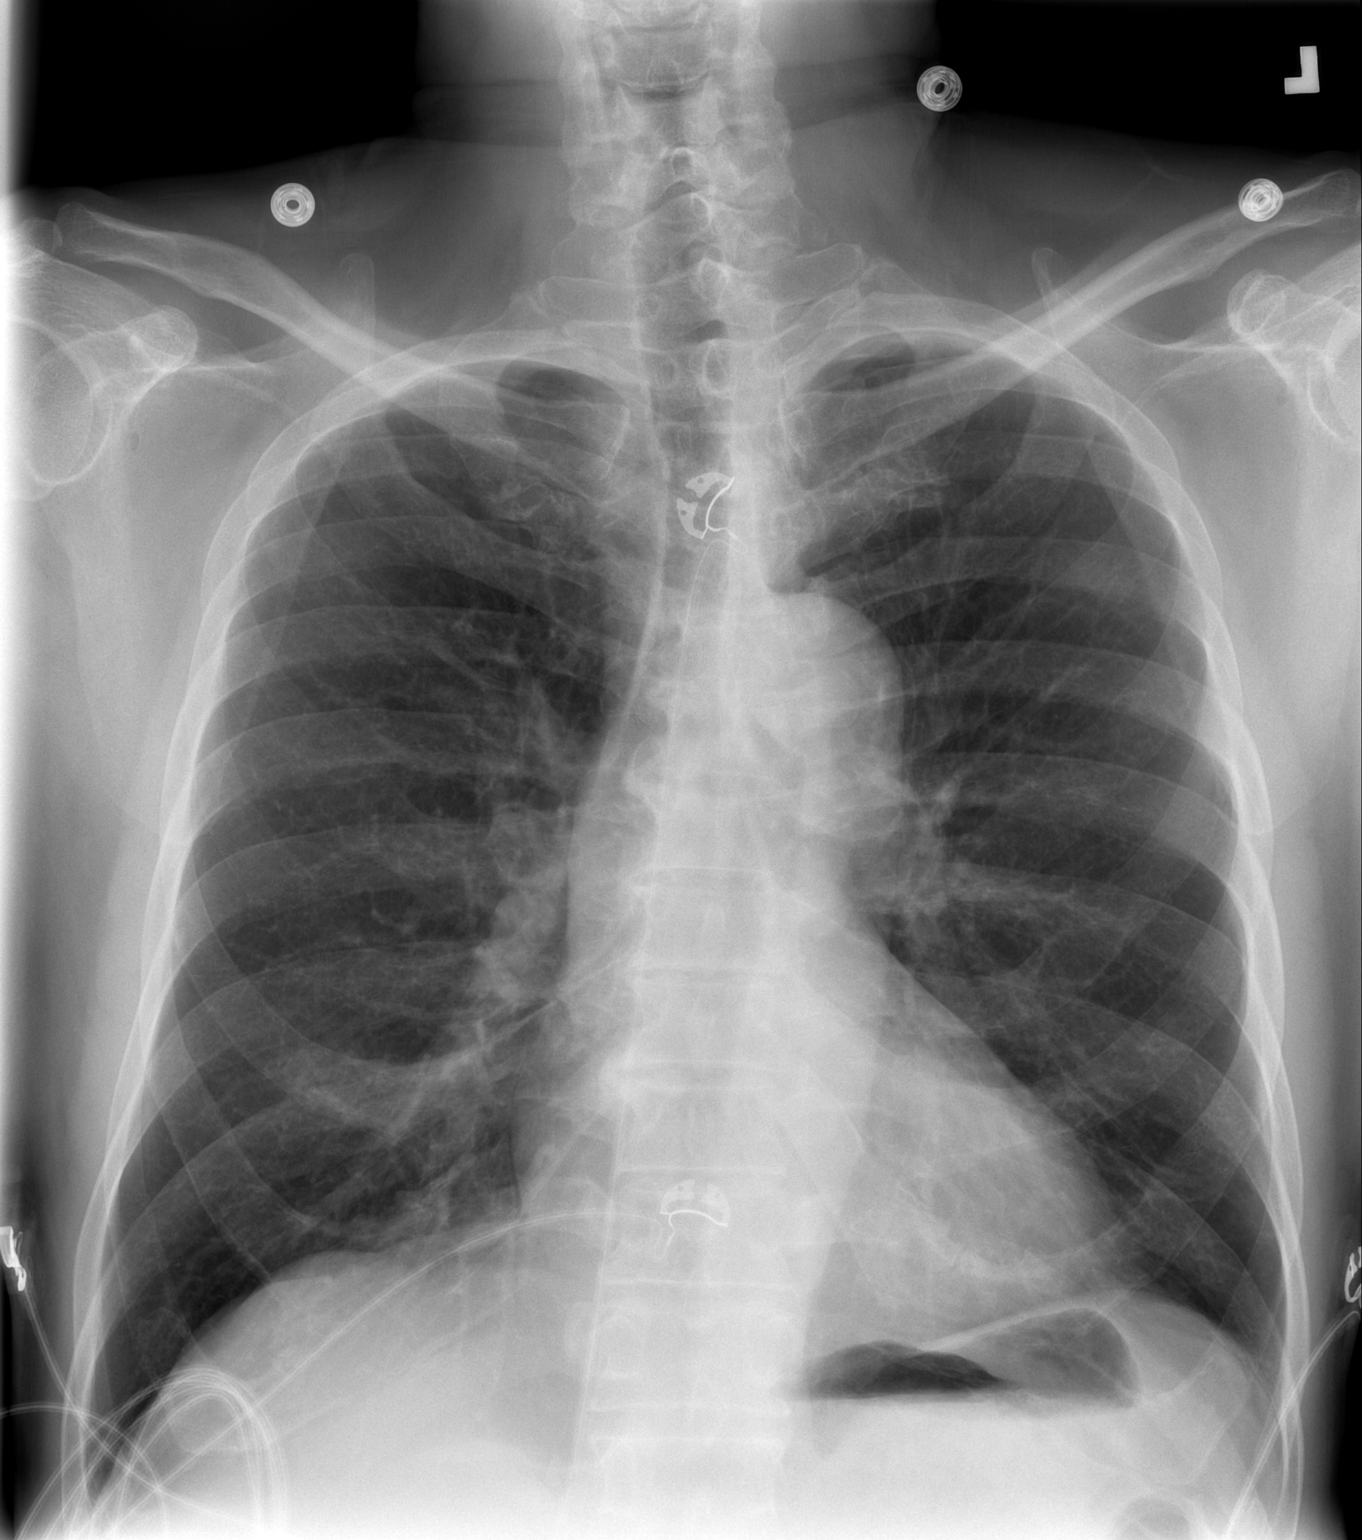

[w chest lat]
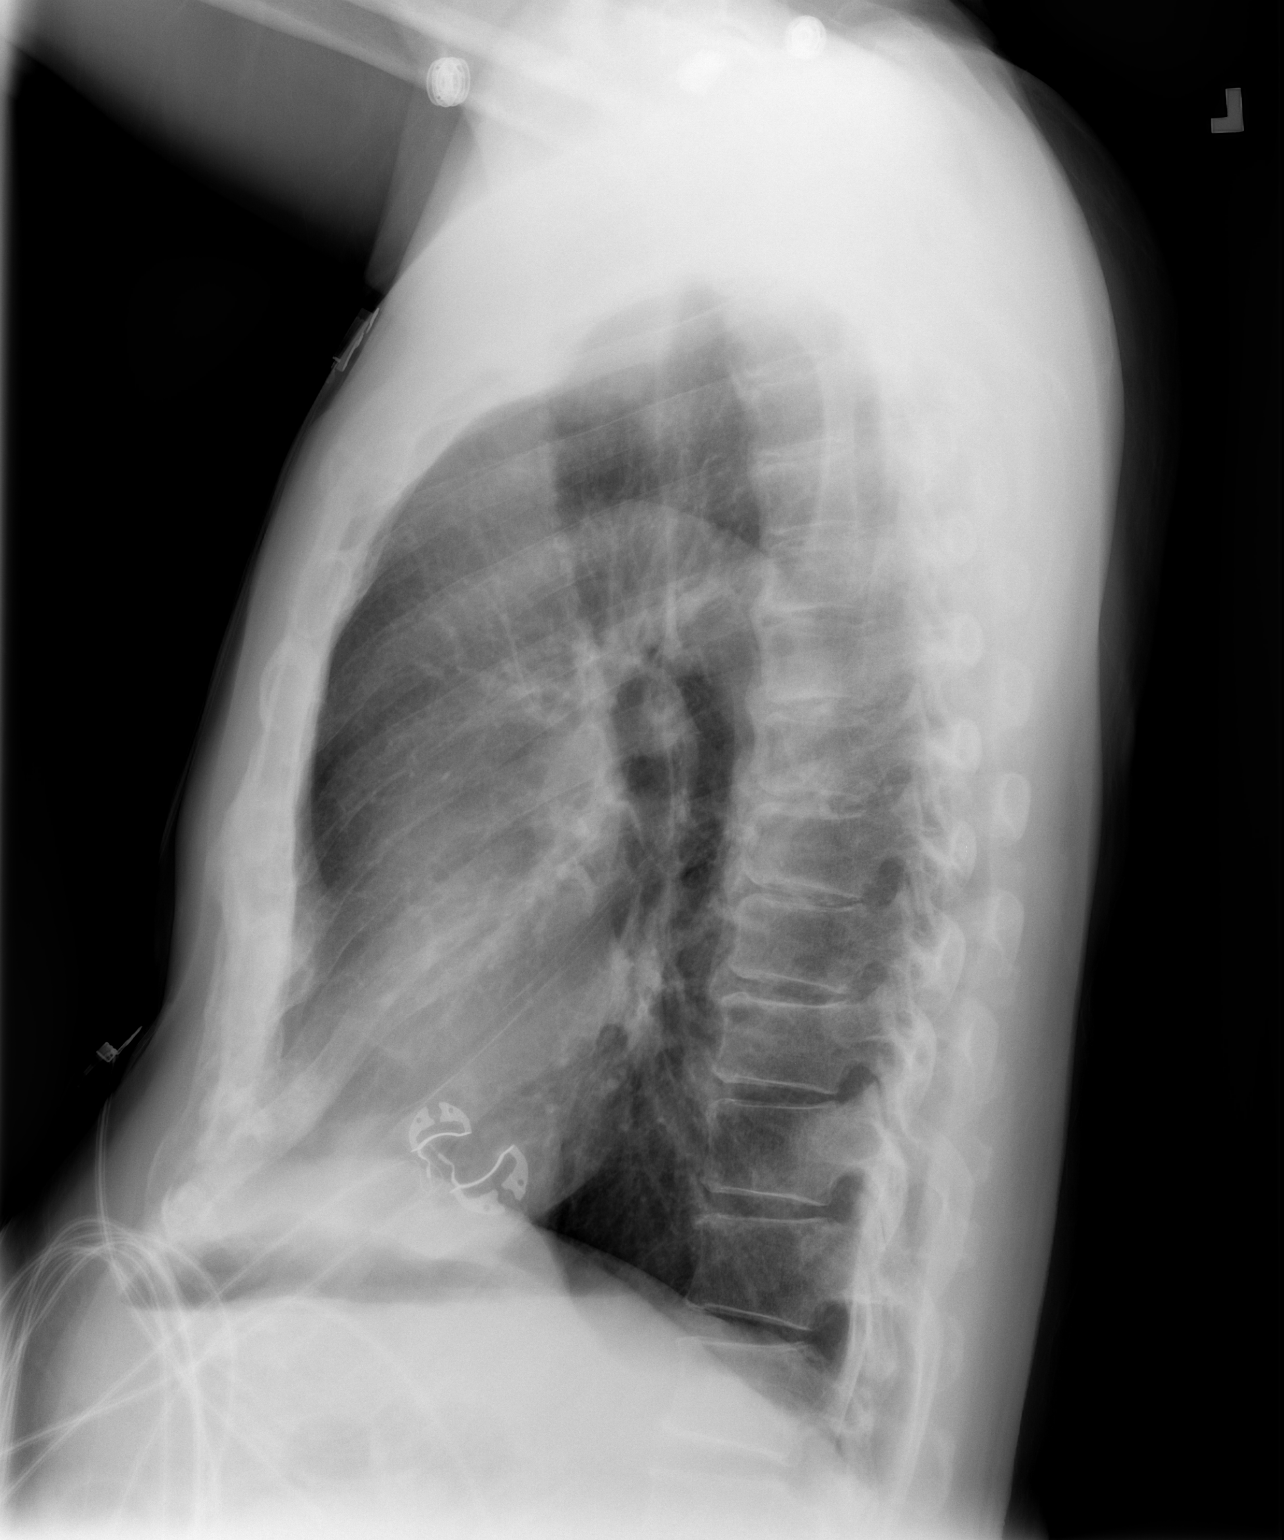

[2 of 2 positions shown; findings below may reference images not displayed]

FINDINGS: Lungs are hyperinflated.  There is perihilar bronchitic
change.  Heart size is enlarged.  No pulmonary edema.  There are no
focal consolidations or pleural effusions.  Degenerative changes
are seen in the spine.
IMPRESSION: 1.  Hyperinflation and bronchitic change.
2. No evidence for acute cardiopulmonary abnormality.

## 2012-06-24 ENCOUNTER — Other Ambulatory Visit: Payer: Self-pay | Admitting: Family Medicine

## 2012-07-26 ENCOUNTER — Other Ambulatory Visit: Payer: Self-pay | Admitting: Family Medicine

## 2012-11-10 ENCOUNTER — Other Ambulatory Visit: Payer: Self-pay | Admitting: Family Medicine

## 2013-03-22 ENCOUNTER — Ambulatory Visit (INDEPENDENT_AMBULATORY_CARE_PROVIDER_SITE_OTHER): Payer: PRIVATE HEALTH INSURANCE | Admitting: Family Medicine

## 2013-03-22 ENCOUNTER — Encounter: Payer: Self-pay | Admitting: Family Medicine

## 2013-03-22 VITALS — BP 147/74 | HR 91 | Temp 97.9°F | Ht 63.0 in | Wt 149.0 lb

## 2013-03-22 DIAGNOSIS — I471 Supraventricular tachycardia: Secondary | ICD-10-CM

## 2013-03-22 DIAGNOSIS — I10 Essential (primary) hypertension: Secondary | ICD-10-CM

## 2013-03-22 DIAGNOSIS — Z23 Encounter for immunization: Secondary | ICD-10-CM

## 2013-03-22 DIAGNOSIS — Z0000 Encounter for general adult medical examination without abnormal findings: Secondary | ICD-10-CM

## 2013-03-22 LAB — BASIC METABOLIC PANEL
BUN: 8 mg/dL (ref 6–23)
CO2: 26 mEq/L (ref 19–32)
Glucose, Bld: 104 mg/dL — ABNORMAL HIGH (ref 70–99)
Potassium: 4 mEq/L (ref 3.5–5.3)
Sodium: 138 mEq/L (ref 135–145)

## 2013-03-22 MED ORDER — FLUTICASONE-SALMETEROL 100-50 MCG/DOSE IN AEPB: 1.0000 | INHALATION_SPRAY | Freq: Two times a day (BID) | RESPIRATORY_TRACT | Status: DC

## 2013-03-22 NOTE — Patient Instructions (Signed)
You  Seem to be doing well. We gave you a flu shot today and drew some blood work. Since you wanted 'nothing special' I did only a BMEt which looks at kidney function and electrolytes and glucose. I will send you a note about that.   I will refill your Advair. Please consider calling your eye doctor for a check up as it seems it has been 3-4 years---I don't think they are going to call you! It was great to see you!

## 2013-03-27 ENCOUNTER — Encounter: Payer: Self-pay | Admitting: Family Medicine

## 2013-03-27 DIAGNOSIS — Z0000 Encounter for general adult medical examination without abnormal findings: Secondary | ICD-10-CM | POA: Insufficient documentation

## 2013-03-27 NOTE — Progress Notes (Signed)
   Subjective:    Patient ID: [name], male    DOB: 1940/08/05, 72 y.o.   MRN: [id number]  HPI  Here for checkup of chronic issues. He's been taking his blood pressure medicine regularly. He's had no problems with it. He feels pretty well, no problems with sleep, energy level or food intake. This is mood is pretty stable.  Review of Systems  Constitutional: Negative for fever, appetite change and unexpected weight change.  Eyes: Negative for pain and visual disturbance.  Respiratory: Negative for chest tightness and shortness of breath.   Cardiovascular: Negative for chest pain and palpitations.  Gastrointestinal: Negative for abdominal pain, diarrhea and constipation.  Genitourinary: Positive for difficulty urinating. Negative for frequency and testicular pain.  Musculoskeletal: Negative for joint swelling.  Skin: Negative for rash.  Neurological: Negative for weakness and headaches.  Psychiatric/Behavioral: Negative for hallucinations, sleep disturbance, dysphoric mood and decreased concentration. The patient is not nervous/anxious.        Objective:   Physical Exam  Constitutional: He is oriented to person, place, and time. He appears well-developed and well-nourished.  HENT:  Head: Normocephalic and atraumatic.  Right Ear: External ear normal.  Left Ear: External ear normal.  Nose: Nose normal.  Mouth/Throat: Oropharynx is clear and moist.  Eyes: Conjunctivae and EOM are normal. Right eye exhibits no discharge. Left eye exhibits no discharge. No scleral icterus.  Neck: Normal range of motion. Neck supple. No thyromegaly present.  Cardiovascular: Normal rate, regular rhythm, normal heart sounds and intact distal pulses.   Pulmonary/Chest: Effort normal and breath sounds normal. He has no wheezes.  Abdominal: Soft. Bowel sounds are normal.  Musculoskeletal: Normal range of motion.  Lymphadenopathy:    He has no cervical adenopathy.  Neurological: He is alert and  oriented to person, place, and time.  Skin: No rash noted.  Psychiatric: He has a normal mood and affect. His behavior is normal. Judgment and thought content normal.          Assessment & Plan:  #1. Preventive care update. He seems to be doing well except for some mild hesitancy with urination. He does not want to consider going on any type of medication for this. He agreed finally to the flu shot. He does not want to get a colonoscopy.  #2. Hypertension: His blood pressure fairly well controlled. I will keep him on the same medicines #3. History of vitreous hemorrhage. I recommended he followup with his eye doctor whom he has not seen in 3 or 4 years. #4. History of paroxysmal atrial tachycardia. Today's in sinus rhythm and he relates no significant recent episodes that he is aware of. I will continue him on the aspirin. In past discussions he has not want to consider anything other than aspirin for anticoagulation and he has not changed his mind today.

## 2013-03-28 ENCOUNTER — Encounter: Payer: Self-pay | Admitting: Family Medicine

## 2013-07-03 ENCOUNTER — Other Ambulatory Visit: Payer: Self-pay | Admitting: Family Medicine

## 2013-07-31 ENCOUNTER — Other Ambulatory Visit: Payer: Self-pay | Admitting: Family Medicine

## 2014-05-28 ENCOUNTER — Other Ambulatory Visit: Payer: Self-pay | Admitting: Family Medicine

## 2014-07-22 ENCOUNTER — Other Ambulatory Visit: Payer: Self-pay | Admitting: Family Medicine

## 2014-07-24 NOTE — Telephone Encounter (Signed)
LVM for pt to call back and inform him that his Rx has been filled and of below. Lamonte SakaiZimmerman Rumple, April D

## 2014-07-24 NOTE — Telephone Encounter (Signed)
Dear Cliffton AstersWhite Team Please let him know I need to see him for an appointment and some lab work in next 2 months or so [facility]! Denny LevySara Britania Shreeve

## 2014-08-21 ENCOUNTER — Other Ambulatory Visit: Payer: Self-pay | Admitting: *Deleted

## 2014-08-21 MED ORDER — AMLODIPINE BESYLATE 5 MG PO TABS: 5.0000 mg | ORAL_TABLET | Freq: Every day | ORAL | Status: DC

## 2015-02-27 ENCOUNTER — Ambulatory Visit (INDEPENDENT_AMBULATORY_CARE_PROVIDER_SITE_OTHER): Payer: Commercial Managed Care - PPO | Admitting: Family Medicine

## 2015-02-27 ENCOUNTER — Encounter: Payer: Self-pay | Admitting: Family Medicine

## 2015-02-27 VITALS — BP 142/88 | HR 117 | Temp 98.3°F | Ht 63.0 in | Wt 149.6 lb

## 2015-02-27 DIAGNOSIS — I471 Supraventricular tachycardia: Secondary | ICD-10-CM

## 2015-02-27 DIAGNOSIS — I10 Essential (primary) hypertension: Secondary | ICD-10-CM | POA: Diagnosis not present

## 2015-02-27 DIAGNOSIS — Z23 Encounter for immunization: Secondary | ICD-10-CM | POA: Diagnosis not present

## 2015-02-27 DIAGNOSIS — R42 Dizziness and giddiness: Secondary | ICD-10-CM | POA: Diagnosis not present

## 2015-02-27 LAB — CBC WITH DIFFERENTIAL/PLATELET
BASOS PCT: 1 % (ref 0–1)
Basophils Absolute: 0.1 10*3/uL (ref 0.0–0.1)
EOS PCT: 2 % (ref 0–5)
Eosinophils Absolute: 0.1 10*3/uL (ref 0.0–0.7)
HCT: 46.5 % (ref 39.0–52.0)
Hemoglobin: 16.6 g/dL (ref 13.0–17.0)
Lymphocytes Relative: 43 % (ref 12–46)
Lymphs Abs: 2.5 10*3/uL (ref 0.7–4.0)
MCH: 30.9 pg (ref 26.0–34.0)
MCHC: 35.7 g/dL (ref 30.0–36.0)
MCV: 86.4 fL (ref 78.0–100.0)
MONOS PCT: 14 % — AB (ref 3–12)
MPV: 9.8 fL (ref 8.6–12.4)
Monocytes Absolute: 0.8 10*3/uL (ref 0.1–1.0)
NEUTROS PCT: 40 % — AB (ref 43–77)
Neutro Abs: 2.3 10*3/uL (ref 1.7–7.7)
PLATELETS: 198 10*3/uL (ref 150–400)
RBC: 5.38 MIL/uL (ref 4.22–5.81)
RDW: 13.3 % (ref 11.5–15.5)
WBC: 5.8 10*3/uL (ref 4.0–10.5)

## 2015-02-27 LAB — COMPREHENSIVE METABOLIC PANEL
ALBUMIN: 4.5 g/dL (ref 3.6–5.1)
ALK PHOS: 44 U/L (ref 40–115)
ALT: 23 U/L (ref 9–46)
AST: 25 U/L (ref 10–35)
BUN: 12 mg/dL (ref 7–25)
CALCIUM: 9.5 mg/dL (ref 8.6–10.3)
CO2: 25 mmol/L (ref 20–31)
Chloride: 102 mmol/L (ref 98–110)
Creat: 0.91 mg/dL (ref 0.70–1.18)
Glucose, Bld: 110 mg/dL — ABNORMAL HIGH (ref 65–99)
Potassium: 4.2 mmol/L (ref 3.5–5.3)
Sodium: 139 mmol/L (ref 135–146)
Total Bilirubin: 1.3 mg/dL — ABNORMAL HIGH (ref 0.2–1.2)
Total Protein: 7.9 g/dL (ref 6.1–8.1)

## 2015-02-27 NOTE — Assessment & Plan Note (Signed)
Flu shot and pneumonia shot today. We'll check some lab work. He does not want any other preventive care such as PSA or colonoscopy.

## 2015-02-27 NOTE — Assessment & Plan Note (Signed)
Using his Advair regularly and not having to use his rescue inhaler very often at all.

## 2015-02-27 NOTE — Assessment & Plan Note (Signed)
Pretty good control. We'll continue current medicines. Check some labs.

## 2015-02-27 NOTE — Assessment & Plan Note (Signed)
He's not ideally rate controlled I suspect. Times today his pulse was in the low 100s. That may be related to anxiety in at the physician's office. He's not having any problems with his activities by his report. He does not want to consider any other medication for his heart specifically no rate control, no alternative anticoagulation.

## 2015-02-27 NOTE — Progress Notes (Signed)
   Subjective:    Patient ID: [name], male    DOB: 08/22/1940, 74 y.o.   MRN: [id number]  HPI Here for checkup. Taking his medicines regularly. Has some questions about side effects and side effect potentials. He agrees to flu shot today. #2. Only real issue he is having is that when he squats down for any length of time, upon arising he has some brief dizziness lasting less than 30 seconds. He does not get this dizziness with going from a seated to standing position. He continues to have irregular heart rate and takes his aspirin. He does not want to consider anything else for reduction of heart rate or anticoagulation. He's taking his blood pressure medicines regularly without any problem. His energy is pretty good given his age he says.   Review of Systems  Constitutional: Negative for diaphoresis, activity change, appetite change, fatigue and unexpected weight change.  HENT: Negative for sore throat.   Eyes: Negative for visual disturbance.  Respiratory: Negative for choking, chest tightness, shortness of breath and wheezing.   Gastrointestinal: Negative for nausea, vomiting, abdominal pain and diarrhea.  Genitourinary: Negative for dysuria.  Musculoskeletal:       Occasional aches and pains particularly in his right shoulder and particularly night. Sometimes he'll have some stiffness in his right knuckle area MCP #1 and 2. This is improved with the day.  Neurological: Negative for speech difficulty.       See history of present illness  Psychiatric/Behavioral: Negative for hallucinations, confusion, sleep disturbance, dysphoric mood, decreased concentration and agitation.       Objective:   Physical Exam  Constitutional: He is oriented to person, place, and time. He appears well-developed and well-nourished.  HENT:  Head: Normocephalic and atraumatic.  Mouth/Throat: Oropharynx is clear and moist.  Eyes: Conjunctivae and EOM are normal. Pupils are equal, round, and reactive  to light.  Neck: Neck supple. No JVD present. No thyromegaly present.  Cardiovascular:  Irregularly irregular. Rate is somewhere between 80 and 110. I hear no murmur. Distal pulses are intact and 2+ symmetrical.  Pulmonary/Chest: Effort normal and breath sounds normal. He has no wheezes.  Abdominal: Soft. Bowel sounds are normal.  Musculoskeletal: Normal range of motion.  Some stiffness in the right shoulder with abduction and with supraspinatus testing but he has full strength in all planes the rotator cuff. Very mild synovial thickening of his right MCP of the index and long finger but they're nontender, not erythematous, not warm.  Neurological: He is alert and oriented to person, place, and time. He exhibits normal muscle tone. Coordination normal.  Psychiatric: He has a normal mood and affect. His behavior is normal. Judgment and thought content normal.          Assessment & Plan:

## 2015-02-27 NOTE — Patient Instructions (Signed)
I will send you a note about your blood work. Please continue taking your medicines as you have been, regularly. It sounds like you are having a bit of postural hypotension (dizziness for a few seconds when you stand). As we discussed, be very careful when going from lying down or stooping down to a standing position; As we get older it takes longer for the blood pressure to equilibrate. Make sure you remain hydrated, We gave you a flu shot today. It was great to see you. I will p[lan to see you back in about a year unless something new comes up. Give us a call if anything new develops or if you have questions or other needs.

## 2015-03-11 ENCOUNTER — Encounter: Payer: Self-pay | Admitting: Family Medicine

## 2015-06-25 ENCOUNTER — Other Ambulatory Visit: Payer: Self-pay | Admitting: *Deleted

## 2015-06-25 MED ORDER — FLUTICASONE-SALMETEROL 100-50 MCG/DOSE IN AEPB: 1.0000 | INHALATION_SPRAY | Freq: Two times a day (BID) | RESPIRATORY_TRACT | Status: DC

## 2015-06-25 MED ORDER — LISINOPRIL 20 MG PO TABS: 20.0000 mg | ORAL_TABLET | Freq: Every day | ORAL | Status: DC

## 2015-08-06 ENCOUNTER — Other Ambulatory Visit: Payer: Self-pay | Admitting: *Deleted

## 2015-08-07 MED ORDER — AMLODIPINE BESYLATE 5 MG PO TABS: 5.0000 mg | ORAL_TABLET | Freq: Every day | ORAL | Status: DC

## 2016-04-29 ENCOUNTER — Other Ambulatory Visit: Payer: Self-pay | Admitting: Family Medicine

## 2016-05-20 ENCOUNTER — Encounter: Payer: Self-pay | Admitting: Family Medicine

## 2016-05-20 ENCOUNTER — Ambulatory Visit (INDEPENDENT_AMBULATORY_CARE_PROVIDER_SITE_OTHER): Payer: Commercial Managed Care - PPO | Admitting: Family Medicine

## 2016-05-20 VITALS — BP 130/100 | HR 77 | Temp 98.4°F | Ht 63.0 in | Wt 145.0 lb

## 2016-05-20 DIAGNOSIS — Z0000 Encounter for general adult medical examination without abnormal findings: Secondary | ICD-10-CM | POA: Diagnosis not present

## 2016-05-20 DIAGNOSIS — I10 Essential (primary) hypertension: Secondary | ICD-10-CM | POA: Diagnosis not present

## 2016-05-20 DIAGNOSIS — J4520 Mild intermittent asthma, uncomplicated: Secondary | ICD-10-CM

## 2016-05-20 DIAGNOSIS — I4719 Other supraventricular tachycardia: Secondary | ICD-10-CM

## 2016-05-20 DIAGNOSIS — I471 Supraventricular tachycardia: Secondary | ICD-10-CM | POA: Diagnosis not present

## 2016-05-20 LAB — BASIC METABOLIC PANEL WITH GFR
BUN: 14 mg/dL (ref 7–25)
CHLORIDE: 104 mmol/L (ref 98–110)
CO2: 26 mmol/L (ref 20–31)
CREATININE: 0.93 mg/dL (ref 0.70–1.18)
Calcium: 9.5 mg/dL (ref 8.6–10.3)
GFR, Est African American: >89 mL/min (ref >=60)
GFR, Est Non African American: 81 mL/min (ref >=60)
Glucose, Bld: 107 mg/dL — ABNORMAL HIGH (ref 65–99)
POTASSIUM: 4.2 mmol/L (ref 3.5–5.3)
Sodium: 140 mmol/L (ref 135–146)

## 2016-05-20 LAB — LDL CHOLESTEROL, DIRECT: Direct LDL: 134 mg/dL — ABNORMAL HIGH (ref <130)

## 2016-05-20 MED ORDER — AMLODIPINE BESYLATE 5 MG PO TABS: 5.0000 mg | ORAL_TABLET | Freq: Every day | ORAL | 3 refills | Status: DC

## 2016-05-20 MED ORDER — LISINOPRIL 20 MG PO TABS: 20.0000 mg | ORAL_TABLET | Freq: Every day | ORAL | 3 refills | Status: DC

## 2016-05-20 MED ORDER — FLUTICASONE-SALMETEROL 100-50 MCG/DOSE IN AEPB: 1.0000 | INHALATION_SPRAY | Freq: Two times a day (BID) | RESPIRATORY_TRACT | 11 refills | Status: DC

## 2016-05-20 NOTE — Assessment & Plan Note (Signed)
He reports no episodes in the last few months

## 2016-05-20 NOTE — Patient Instructions (Signed)
I would recommend taking a baby aspirin every day or at least every other day for stroke prevention. Please continue to take your current medications as we discussed. I will send you a note about your lab work. It was great to see you!

## 2016-05-20 NOTE — Progress Notes (Signed)
    CHIEF COMPLAINT / HPI:   #1. Follow-up hypertension and occasional atrial arrhythmia. He ran out of his blood pressure medicines. He's been out of them about 10 days. He's had no chest pain and no shortness of breath. He's also not noted any episodes of his heart racing or fluttering in recent months. He again tells me he really does not want to do any more medicines than he has to. He reminds me that his ultimate goal is to diet and his sleep so that his family does not have to take care of him.  REVIEW OF SYSTEMS:  Retired, he is active no some occasional walking. No unusual weight change, no fever, no myalgias, no arthralgias. See history of present illness above for  additional pertinent review of systems  OBJECTIVE:    Vital signs reviewed GENERALl: Well developed, well nourished, in no acute distress. HEENT: PERRLA, EOMI, sclerae are nonicteric NECK: Supple, FROM, without lymphadenopathy.  THYROID: normal without nodularity CAROTID ARTERIES: without bruits LUNGS: clear to auscultation bilaterally. No wheezes or rales. Normal respiratory effort HEART: Regular rate and rhythm, no murmurs. Distal pulses are bilaterally symmetrical, 2+. ABDOMEN: soft with positive bowel sounds. No masses noted MSK: MOE x 4. Normal muscle strength, bulk and tone. SKIN no rash. Normal temperature. NEURO: no focal deficits. Normal gait. Normal balance.   ASSESSMENT / PLAN: Please see problem oriented charting for details

## 2016-05-20 NOTE — Assessment & Plan Note (Signed)
He does not want updated tetanus shot, he does not want flu vaccine.

## 2016-05-20 NOTE — Assessment & Plan Note (Signed)
Lab work today. I will refill his medicines for one year. We again discussed prophylaxis with baby aspirin. I recommended it. He agrees to consider it and will potentially take it either every day or every other day.

## 2016-05-20 NOTE — Assessment & Plan Note (Signed)
For 2-3 months he year he uses his inhaler. Rest the time he says he does not need it.

## 2016-05-26 ENCOUNTER — Encounter: Payer: Self-pay | Admitting: Family Medicine

## 2016-07-12 ENCOUNTER — Other Ambulatory Visit: Payer: Self-pay | Admitting: Family Medicine

## 2016-10-13 ENCOUNTER — Other Ambulatory Visit: Payer: Self-pay | Admitting: *Deleted

## 2016-10-13 MED ORDER — LISINOPRIL 20 MG PO TABS: 20.0000 mg | ORAL_TABLET | Freq: Every day | ORAL | 1 refills | Status: DC

## 2016-10-13 MED ORDER — AMLODIPINE BESYLATE 5 MG PO TABS: 5.0000 mg | ORAL_TABLET | Freq: Every day | ORAL | 1 refills | Status: DC

## 2016-11-01 ENCOUNTER — Other Ambulatory Visit: Payer: Self-pay | Admitting: Family Medicine

## 2016-11-24 ENCOUNTER — Other Ambulatory Visit: Payer: Self-pay | Admitting: Family Medicine

## 2017-12-01 ENCOUNTER — Other Ambulatory Visit: Payer: Self-pay

## 2017-12-01 ENCOUNTER — Encounter: Payer: Self-pay | Admitting: Family Medicine

## 2017-12-01 ENCOUNTER — Ambulatory Visit (INDEPENDENT_AMBULATORY_CARE_PROVIDER_SITE_OTHER): Payer: Commercial Managed Care - PPO | Admitting: Family Medicine

## 2017-12-01 VITALS — BP 144/80 | HR 84 | Temp 98.3°F | Ht 63.0 in | Wt 138.6 lb

## 2017-12-01 DIAGNOSIS — I10 Essential (primary) hypertension: Secondary | ICD-10-CM

## 2017-12-01 MED ORDER — AMLODIPINE BESYLATE 5 MG PO TABS: ORAL_TABLET | ORAL | 3 refills | Status: DC

## 2017-12-01 MED ORDER — LISINOPRIL 20 MG PO TABS: 20.0000 mg | ORAL_TABLET | Freq: Every day | ORAL | 3 refills | Status: DC

## 2017-12-01 MED ORDER — FLUTICASONE-SALMETEROL 100-50 MCG/DOSE IN AEPB: 1.0000 | INHALATION_SPRAY | Freq: Two times a day (BID) | RESPIRATORY_TRACT | 11 refills | Status: DC

## 2017-12-01 NOTE — Patient Instructions (Signed)
I will send you a note about your blood work. I sent your refills for your BP med to [facility] and you inhaler refill to your local [facility]. Don't forget to consider getting you flu shot next month when they become available. Great to see you!

## 2017-12-02 LAB — BASIC METABOLIC PANEL
BUN/Creatinine Ratio: 12 (ref 10–24)
BUN: 10 mg/dL (ref 8–27)
CALCIUM: 9.6 mg/dL (ref 8.6–10.2)
CO2: 26 mmol/L (ref 20–29)
CREATININE: 0.84 mg/dL (ref 0.76–1.27)
Chloride: 102 mmol/L (ref 96–106)
GFR calc Af Amer: 98 mL/min/{1.73_m2} (ref >59)
GFR, EST NON AFRICAN AMERICAN: 85 mL/min/{1.73_m2} (ref >59)
Glucose: 104 mg/dL — ABNORMAL HIGH (ref 65–99)
Potassium: 4.6 mmol/L (ref 3.5–5.2)
Sodium: 141 mmol/L (ref 134–144)

## 2017-12-03 NOTE — Progress Notes (Signed)
    CHIEF COMPLAINT / HPI: Follow-up hypertension.  Taking medicines regularly.  No medication problems.  Activity and energy level stable.  Feels good most of the time.  REVIEW OF SYSTEMS: No unusual weight change.  No chest pain with exertion.  No PND.  No lower extremity edema.  No headaches.  PERTINENT  PMH / PSH: I have reviewed the patient's medications, allergies, past medical and surgical history, smoking status and updated in the EMR as appropriate.   OBJECTIVE:  Vital signs reviewed. GENERAL: Well-developed, well-nourished, no acute distress. CARDIOVASCULAR: Regular rate and rhythm no murmur gallop or rub LUNGS: Clear to auscultation bilaterally, no rales or wheeze. ABDOMEN: Soft positive bowel sounds NEURO: No gross focal neurological deficits. MSK: Movement of extremity x 4.    ASSESSMENT / PLAN:  No problem-specific Assessment & Plan notes found for this encounter.

## 2017-12-03 NOTE — Assessment & Plan Note (Addendum)
Refill his medications.  We will get some labs.  He seems to be doing relatively well.  He is not interested in doing any health maintenance update such as colonoscopy.

## 2017-12-14 ENCOUNTER — Encounter: Payer: Self-pay | Admitting: Family Medicine

## 2018-01-05 ENCOUNTER — Encounter: Payer: Self-pay | Admitting: Family Medicine

## 2018-01-06 ENCOUNTER — Other Ambulatory Visit: Payer: Self-pay | Admitting: Family Medicine

## 2018-01-06 MED ORDER — FLUTICASONE PROPIONATE 50 MCG/ACT NA SUSP: 2.0000 | Freq: Every day | NASAL | 6 refills | Status: DC

## 2018-08-24 ENCOUNTER — Other Ambulatory Visit: Payer: Self-pay | Admitting: Family Medicine

## 2018-10-19 ENCOUNTER — Other Ambulatory Visit: Payer: Self-pay | Admitting: Family Medicine

## 2018-12-13 ENCOUNTER — Other Ambulatory Visit: Payer: Self-pay

## 2018-12-13 MED ORDER — FLUTICASONE-SALMETEROL 100-50 MCG/DOSE IN AEPB: 1.0000 | INHALATION_SPRAY | Freq: Two times a day (BID) | RESPIRATORY_TRACT | 12 refills | Status: DC

## 2019-02-11 ENCOUNTER — Other Ambulatory Visit: Payer: Self-pay | Admitting: Family Medicine

## 2019-04-29 ENCOUNTER — Ambulatory Visit: Payer: Self-pay | Attending: Internal Medicine

## 2019-04-29 DIAGNOSIS — Z23 Encounter for immunization: Secondary | ICD-10-CM | POA: Insufficient documentation

## 2019-04-29 NOTE — Progress Notes (Signed)
   Covid-19 Vaccination Clinic  Name:  Eldra Word    MRN: [id number] DOB: March 14, 1941  04/29/2019  Mr. Hargadon was observed post Covid-19 immunization for 15 minutes without incidence. He was provided with Vaccine Information Sheet and instruction to access the V-Safe system.   Mr. Emberton was instructed to call 911 with any severe reactions post vaccine: Marland Kitchen Difficulty breathing  . Swelling of your face and throat  . A fast heartbeat  . A bad rash all over your body  . Dizziness and weakness    Immunizations Administered    Name Date Dose VIS Date Route   Pfizer COVID-19 Vaccine 04/29/2019 11:21 AM 0.3 mL 03/31/2019 Intramuscular   Manufacturer: [employer], [employer]   Lot: [id number]   NDC: [address]-2

## 2019-05-20 ENCOUNTER — Ambulatory Visit: Payer: Self-pay

## 2019-05-20 ENCOUNTER — Ambulatory Visit: Payer: Self-pay | Attending: Internal Medicine

## 2019-05-20 DIAGNOSIS — Z23 Encounter for immunization: Secondary | ICD-10-CM | POA: Insufficient documentation

## 2019-05-20 NOTE — Progress Notes (Signed)
   Covid-19 Vaccination Clinic  Name:  [name]    MRN: [id number] DOB: [birth year]  05/20/2019  [name] was observed post Covid-19 immunization for 15 minutes without incidence. He was provided with Vaccine Information Sheet and instruction to access the V-Safe system.   [name] was instructed to call 911 with any severe reactions post vaccine: Marland Kitchen Difficulty breathing  . Swelling of your face and throat  . A fast heartbeat  . A bad rash all over your body  . Dizziness and weakness    Immunizations Administered    Name Date Dose VIS Date Route   Pfizer COVID-19 Vaccine 05/20/2019 11:50 AM 0.3 mL 03/31/2019 Intramuscular   Manufacturer: [employer], [employer]   Lot: [id number]   NDC: [address]-9

## 2019-08-13 ENCOUNTER — Encounter: Payer: Self-pay | Admitting: Family Medicine

## 2019-08-14 ENCOUNTER — Other Ambulatory Visit: Payer: Self-pay | Admitting: Family Medicine

## 2019-08-14 MED ORDER — AMLODIPINE BESYLATE 5 MG PO TABS: ORAL_TABLET | ORAL | 3 refills | Status: DC

## 2019-08-14 MED ORDER — LISINOPRIL 20 MG PO TABS: 20.0000 mg | ORAL_TABLET | Freq: Every day | ORAL | 3 refills | Status: DC

## 2019-08-17 ENCOUNTER — Other Ambulatory Visit: Payer: Self-pay | Admitting: Family Medicine

## 2019-08-17 MED ORDER — LISINOPRIL 20 MG PO TABS: 20.0000 mg | ORAL_TABLET | Freq: Every day | ORAL | 3 refills | Status: DC

## 2019-08-17 MED ORDER — AMLODIPINE BESYLATE 5 MG PO TABS: ORAL_TABLET | ORAL | 3 refills | Status: DC

## 2019-08-30 ENCOUNTER — Ambulatory Visit: Payer: Managed Care, Other (non HMO) | Admitting: Family Medicine

## 2019-08-30 ENCOUNTER — Encounter: Payer: Self-pay | Admitting: Family Medicine

## 2019-08-30 ENCOUNTER — Other Ambulatory Visit: Payer: Self-pay

## 2019-08-30 VITALS — BP 142/74 | HR 82 | Ht 63.0 in | Wt 131.4 lb

## 2019-08-30 DIAGNOSIS — I10 Essential (primary) hypertension: Secondary | ICD-10-CM | POA: Diagnosis not present

## 2019-08-30 DIAGNOSIS — Z0000 Encounter for general adult medical examination without abnormal findings: Secondary | ICD-10-CM | POA: Diagnosis not present

## 2019-08-30 DIAGNOSIS — J301 Allergic rhinitis due to pollen: Secondary | ICD-10-CM | POA: Diagnosis not present

## 2019-08-30 MED ORDER — TETANUS-DIPHTH-ACELL PERTUSSIS 5-2.5-18.5 LF-MCG/0.5 IM SUSP: 0.5000 mL | Freq: Once | INTRAMUSCULAR | 0 refills | Status: AC

## 2019-08-30 NOTE — Patient Instructions (Signed)
You look great! I have sent your tetanus shot refill to the pharmacy as we do not have them here. IF you have any problems, please let me know ibuprofen will send you a note about your blood work. Let me see you in a year unless something comes up sooner. Great to see you!

## 2019-08-31 LAB — CMP14+EGFR
ALT: 17 IU/L (ref 0–44)
AST: 21 IU/L (ref 0–40)
Albumin/Globulin Ratio: 1.4 (ref 1.2–2.2)
Albumin: 4.7 g/dL (ref 3.7–4.7)
Alkaline Phosphatase: 67 IU/L (ref 39–117)
BUN/Creatinine Ratio: 13 (ref 10–24)
BUN: 12 mg/dL (ref 8–27)
Bilirubin Total: 0.6 mg/dL (ref 0.0–1.2)
CO2: 25 mmol/L (ref 20–29)
Calcium: 9.5 mg/dL (ref 8.6–10.2)
Chloride: 102 mmol/L (ref 96–106)
Creatinine, Ser: 0.9 mg/dL (ref 0.76–1.27)
GFR calc Af Amer: 94 mL/min/{1.73_m2} (ref >59)
GFR calc non Af Amer: 82 mL/min/{1.73_m2} (ref >59)
Globulin, Total: 3.4 g/dL (ref 1.5–4.5)
Glucose: 107 mg/dL — ABNORMAL HIGH (ref 65–99)
Potassium: 4.3 mmol/L (ref 3.5–5.2)
Sodium: 141 mmol/L (ref 134–144)
Total Protein: 8.1 g/dL (ref 6.0–8.5)

## 2019-09-01 NOTE — Progress Notes (Signed)
    CHIEF COMPLAINT / HPI: #1.  Follow-up hypertension: Continues to tolerate medicines well.  No problems.  No side effects and no chest pain.  Continues to walk regularly and in fact has been walking more since Covid came.  He has lost a small amount of weight but feels generally very well #2.  Wants updated tetanus shot 3.  Continues to have allergic rhinitis.  Taking his nasal HandiHaler and his pulmonary inhaler once a day each.  PERTINENT  PMH / PSH: I have reviewed the patient's medications, allergies, past medical and surgical history, smoking status and updated in the EMR as appropriate.   OBJECTIVE:  BP (!) 142/74   Pulse 82   Ht 5\' 3"  (1.6 m)   Wt 131 lb 6.4 oz (59.6 kg)   SpO2 99%   BMI 23.28 kg/m  Vital signs reviewed. GENERAL: Well-developed, well-nourished, no acute distress. CARDIOVASCULAR: Regular rate and rhythm no murmur gallop or rub LUNGS: Clear to auscultation bilaterally, no rales or wheeze. ABDOMEN: Soft positive bowel sounds NEURO: No gross focal neurological deficits. MSK: Movement of extremity x 4.    ASSESSMENT / PLAN:   HYPERTENSION, BENIGN SYSTEMIC Continue current medicines.  Labs.  Allergic rhinitis He is only taking his inhalers once a day.  His nasal symptoms are little suboptimally controlled but he would rather stick with once a day so I am fine with that.  He is not having any asthma type symptoms or breakthrough.  Visit for preventive health examination I will give him a prescription for tetanus update as we do not have them here for Medicare patients and you have to get that at the pharmacy.   MD

## 2019-09-01 NOTE — Assessment & Plan Note (Signed)
He is only taking his inhalers once a day.  His nasal symptoms are little suboptimally controlled but he would rather stick with once a day so I am fine with that.  He is not having any asthma type symptoms or breakthrough.

## 2019-09-01 NOTE — Assessment & Plan Note (Signed)
Continue current medicines.  Labs.

## 2019-09-01 NOTE — Assessment & Plan Note (Signed)
I will give him a prescription for tetanus update as we do not have them here for Medicare patients and you have to get that at the pharmacy.

## 2019-09-02 ENCOUNTER — Encounter: Payer: Self-pay | Admitting: Family Medicine

## 2019-09-09 ENCOUNTER — Encounter: Payer: Self-pay | Admitting: Family Medicine

## 2020-10-12 ENCOUNTER — Encounter: Payer: Self-pay | Admitting: Family Medicine

## 2020-10-12 ENCOUNTER — Other Ambulatory Visit: Payer: Self-pay | Admitting: Internal Medicine

## 2020-10-12 MED ORDER — PAXLOVID 20 X 150 MG & 10 X 100MG PO TBPK: 3.0000 | ORAL_TABLET | Freq: Two times a day (BID) | ORAL | 0 refills | Status: DC

## 2020-10-12 NOTE — Progress Notes (Signed)
Started to have cough, headache, muscle ache on Wednesday. He did antigen covid + on Thursday. Still feeling under the weather.  Normal renal function noted on 0.9   Will prescribe paxlovid and have him hold amlodipine 5mg   and hold wixela for 5 days No other drug interactions Gave instructions to take medicine and side effect of bad taste, and diarrhea.

## 2020-10-14 ENCOUNTER — Other Ambulatory Visit: Payer: Self-pay | Admitting: Family Medicine

## 2020-10-19 ENCOUNTER — Other Ambulatory Visit: Payer: Self-pay | Admitting: Family Medicine

## 2020-10-23 ENCOUNTER — Ambulatory Visit (INDEPENDENT_AMBULATORY_CARE_PROVIDER_SITE_OTHER): Payer: PPO | Admitting: Family Medicine

## 2020-10-23 ENCOUNTER — Other Ambulatory Visit: Payer: Self-pay

## 2020-10-23 ENCOUNTER — Encounter: Payer: Self-pay | Admitting: Family Medicine

## 2020-10-23 VITALS — BP 140/80 | HR 102 | Ht 63.0 in | Wt 136.2 lb

## 2020-10-23 DIAGNOSIS — I471 Supraventricular tachycardia: Secondary | ICD-10-CM | POA: Diagnosis not present

## 2020-10-23 DIAGNOSIS — J301 Allergic rhinitis due to pollen: Secondary | ICD-10-CM | POA: Diagnosis not present

## 2020-10-23 DIAGNOSIS — I10 Essential (primary) hypertension: Secondary | ICD-10-CM | POA: Diagnosis not present

## 2020-10-23 MED ORDER — FLUTICASONE-SALMETEROL 100-50 MCG/ACT IN AEPB
1.0000 | INHALATION_SPRAY | Freq: Two times a day (BID) | RESPIRATORY_TRACT | 3 refills | Status: DC
Start: 2020-10-23 — End: 2021-12-10

## 2020-10-23 NOTE — Progress Notes (Signed)
    CHIEF COMPLAINT / HPI: #1.  Follow-up recent diagnosis of COVID.  Still has some nasal congestion but feeling much better overall. #2.  Hypertension: Continues to take his medicine regularly without problem.  Here for regular checkup today 3.  Allergies: He uses the Flonase with some success.  He also occasionally uses the inhaled steroid combination.  He does not use it daily but thinks it does help when he uses it as needed.  He needs a refill on this.  He is doing relatively well.  Says he has less energy than he did 20 years ago but that is to be expected.  Generally feels well.  Other than getting COVID from his daughter recently, things are going well in the family.   PERTINENT  PMH / PSH: I have reviewed the patient's medications, allergies, past medical and surgical history, smoking status and updated in the EMR as appropriate.   OBJECTIVE:  BP 140/80   Pulse (!) 102   Ht 5\' 3"  (1.6 m)   Wt 136 lb 3.2 oz (61.8 kg)   SpO2 97%   BMI 24.13 kg/m  Vital signs reviewed. GENERAL: Well-developed, well-nourished, no acute distress. CARDIOVASCULAR: Regular rate and rhythm no murmur gallop or rub LUNGS: Clear to auscultation bilaterally, no rales or wheeze. ABDOMEN: Soft positive bowel sounds NEURO: No gross focal neurological deficits. MSK: Movement of extremity x 4.   ASSESSMENT / PLAN:   HYPERTENSION, BENIGN SYSTEMIC Labs today.  Continue amlodipine.  Continue lisinopril.  He is no longer taking aspirin I agree with that plan.  Allergic rhinitis Refilled his Flonase and his Advair inhaler.  He uses it as needed.  Atrial tachycardia, paroxysmal (HCC) He reports no episodes of tachycardia or any significant palpitations.   MD

## 2020-10-23 NOTE — Assessment & Plan Note (Signed)
Refilled his Flonase and his Advair inhaler.  He uses it as needed.

## 2020-10-23 NOTE — Assessment & Plan Note (Addendum)
Labs today.  Continue amlodipine.  Continue lisinopril.  He is no longer taking aspirin I agree with that plan.

## 2020-10-23 NOTE — Patient Instructions (Signed)
Will send you a note about your labs. Great to see you! 

## 2020-10-23 NOTE — Assessment & Plan Note (Signed)
He reports no episodes of tachycardia or any significant palpitations.

## 2020-10-24 LAB — COMPREHENSIVE METABOLIC PANEL
ALT: 32 IU/L (ref 0–44)
AST: 19 IU/L (ref 0–40)
Albumin/Globulin Ratio: 1.3 (ref 1.2–2.2)
Albumin: 4.3 g/dL (ref 3.7–4.7)
Alkaline Phosphatase: 70 IU/L (ref 44–121)
BUN/Creatinine Ratio: 14 (ref 10–24)
BUN: 13 mg/dL (ref 8–27)
Bilirubin Total: 0.8 mg/dL (ref 0.0–1.2)
CO2: 23 mmol/L (ref 20–29)
Calcium: 9.2 mg/dL (ref 8.6–10.2)
Chloride: 99 mmol/L (ref 96–106)
Creatinine, Ser: 0.9 mg/dL (ref 0.76–1.27)
Globulin, Total: 3.3 g/dL (ref 1.5–4.5)
Glucose: 103 mg/dL — ABNORMAL HIGH (ref 65–99)
Potassium: 4.5 mmol/L (ref 3.5–5.2)
Sodium: 137 mmol/L (ref 134–144)
Total Protein: 7.6 g/dL (ref 6.0–8.5)
eGFR: 87 mL/min/{1.73_m2} (ref >59)

## 2020-10-24 LAB — LDL CHOLESTEROL, DIRECT: LDL Direct: 125 mg/dL — ABNORMAL HIGH (ref 0–99)

## 2020-10-25 ENCOUNTER — Encounter: Payer: Self-pay | Admitting: Family Medicine

## 2021-09-23 ENCOUNTER — Encounter: Payer: Self-pay | Admitting: *Deleted

## 2021-10-22 ENCOUNTER — Other Ambulatory Visit: Payer: Self-pay | Admitting: Family Medicine

## 2021-10-22 MED ORDER — AMLODIPINE BESYLATE 5 MG PO TABS: ORAL_TABLET | ORAL | 0 refills | Status: DC

## 2021-10-22 MED ORDER — LISINOPRIL 20 MG PO TABS: 20.0000 mg | ORAL_TABLET | Freq: Every day | ORAL | 0 refills | Status: DC

## 2021-10-22 NOTE — Telephone Encounter (Signed)
Patient's daughter reached out to request refills of BP medications. Advised PCP is on vacation; sent in refills. Encouraged to schedule follow up since it's been 1 year since he's been seen.  Latrelle Dodrill, MD

## 2021-11-13 ENCOUNTER — Other Ambulatory Visit: Payer: Self-pay | Admitting: Family Medicine

## 2021-11-14 MED ORDER — LISINOPRIL 20 MG PO TABS: 20.0000 mg | ORAL_TABLET | Freq: Every day | ORAL | 0 refills | Status: DC

## 2021-11-14 MED ORDER — AMLODIPINE BESYLATE 5 MG PO TABS: ORAL_TABLET | ORAL | 0 refills | Status: DC

## 2021-12-10 ENCOUNTER — Encounter: Payer: Self-pay | Admitting: Family Medicine

## 2021-12-10 ENCOUNTER — Ambulatory Visit (INDEPENDENT_AMBULATORY_CARE_PROVIDER_SITE_OTHER): Payer: PPO | Admitting: Family Medicine

## 2021-12-10 VITALS — BP 130/80 | HR 87 | Ht 63.0 in | Wt 135.6 lb

## 2021-12-10 DIAGNOSIS — I10 Essential (primary) hypertension: Secondary | ICD-10-CM | POA: Diagnosis not present

## 2021-12-10 DIAGNOSIS — Z0000 Encounter for general adult medical examination without abnormal findings: Secondary | ICD-10-CM | POA: Diagnosis not present

## 2021-12-10 DIAGNOSIS — M1A9XX0 Chronic gout, unspecified, without tophus (tophi): Secondary | ICD-10-CM | POA: Diagnosis not present

## 2021-12-10 DIAGNOSIS — J4520 Mild intermittent asthma, uncomplicated: Secondary | ICD-10-CM | POA: Diagnosis not present

## 2021-12-10 DIAGNOSIS — J301 Allergic rhinitis due to pollen: Secondary | ICD-10-CM

## 2021-12-10 DIAGNOSIS — I471 Supraventricular tachycardia: Secondary | ICD-10-CM

## 2021-12-10 MED ORDER — AMLODIPINE BESYLATE 5 MG PO TABS: ORAL_TABLET | ORAL | 3 refills | Status: DC

## 2021-12-10 MED ORDER — FLUTICASONE PROPIONATE 50 MCG/ACT NA SUSP: NASAL | 12 refills | Status: DC

## 2021-12-10 MED ORDER — FLUTICASONE-SALMETEROL 100-50 MCG/ACT IN AEPB: 1.0000 | INHALATION_SPRAY | Freq: Two times a day (BID) | RESPIRATORY_TRACT | 3 refills | Status: DC

## 2021-12-10 MED ORDER — LISINOPRIL 20 MG PO TABS: 20.0000 mg | ORAL_TABLET | Freq: Every day | ORAL | 3 refills | Status: DC

## 2021-12-10 NOTE — Patient Instructions (Signed)
I am checking some screening labs today.   I will also check your uric acid level since you are having some occasional gout flares.   As we discussed in the office, if you start having very frequent gout flares, there is a daily medicine you can take.  Additionally, if you ever have a really bad flare that you cannot manage successfully with over-the-counter medicines, please let us know and get an appointment as there are some things that you can do for an acute flare.    I am glad you are doing well.   I will send you note about your labs.  Do not forget to get your flu shot in the next few months and if there is a new COVID booster shot, I would recommend you get that as well.

## 2021-12-11 LAB — CBC
Hematocrit: 44.6 % (ref 37.5–51.0)
Hemoglobin: 15.1 g/dL (ref 13.0–17.7)
MCH: 29.4 pg (ref 26.6–33.0)
MCHC: 33.9 g/dL (ref 31.5–35.7)
MCV: 87 fL (ref 79–97)
Platelets: 214 10*3/uL (ref 150–450)
RBC: 5.14 x10E6/uL (ref 4.14–5.80)
RDW: 12.5 % (ref 11.6–15.4)
WBC: 4.7 10*3/uL (ref 3.4–10.8)

## 2021-12-11 LAB — COMPREHENSIVE METABOLIC PANEL
ALT: 26 IU/L (ref 0–44)
AST: 27 IU/L (ref 0–40)
Albumin/Globulin Ratio: 1.3 (ref 1.2–2.2)
Albumin: 4.3 g/dL (ref 3.8–4.8)
Alkaline Phosphatase: 67 IU/L (ref 44–121)
BUN/Creatinine Ratio: 16 (ref 10–24)
BUN: 14 mg/dL (ref 8–27)
Bilirubin Total: 1 mg/dL (ref 0.0–1.2)
CO2: 20 mmol/L (ref 20–29)
Calcium: 9.1 mg/dL (ref 8.6–10.2)
Chloride: 102 mmol/L (ref 96–106)
Creatinine, Ser: 0.87 mg/dL (ref 0.76–1.27)
Globulin, Total: 3.3 g/dL (ref 1.5–4.5)
Glucose: 104 mg/dL — ABNORMAL HIGH (ref 70–99)
Potassium: 4.3 mmol/L (ref 3.5–5.2)
Sodium: 140 mmol/L (ref 134–144)
Total Protein: 7.6 g/dL (ref 6.0–8.5)
eGFR: 87 mL/min/{1.73_m2} (ref >59)

## 2021-12-11 LAB — URIC ACID: Uric Acid: 8.4 mg/dL (ref 3.8–8.4)

## 2021-12-12 ENCOUNTER — Encounter: Payer: Self-pay | Admitting: Family Medicine

## 2021-12-12 NOTE — Assessment & Plan Note (Signed)
Has had mild intermittent epsiodes. Will check uric acid today for baseline--he is most likely not interested in additional meds as attacks are so limited. We discussed options should he have more severe or more frequent episodes. Avoiding diuretic meds

## 2021-12-12 NOTE — Assessment & Plan Note (Signed)
Not really having any problems with this and he stopped the diltiazem several years ago. Will follow and if he startes to get recurrence of issues he will let  Me know

## 2021-12-12 NOTE — Progress Notes (Signed)
    CHIEF COMPLAINT / HPI:   F/u htn, asthma Doing well No complaints  PERTINENT  PMH / PSH: I have reviewed the patient's medications, allergies, past medical and surgical history, smoking status and updated in the EMR as appropriate.   OBJECTIVE:  BP 130/80   Pulse 87   Ht 5\' 3"  (1.6 m)   Wt 135 lb 9.6 oz (61.5 kg)   SpO2 98%   BMI 24.02 kg/m   Vital signs reviewed. GENERAL: Well-developed, well-nourished, no acute distress. CARDIOVASCULAR: Regular rate and rhythm no murmur gallop or rub LUNGS: Clear to auscultation bilaterally, no rales or wheeze. ABDOMEN: Soft positive bowel sounds NEURO: No gross focal neurological deficits. MSK: Movement of extremity x 4.  ASSESSMENT / PLAN: Preventive care Will get flu shot and any updated COVID booster when available Stay active  HYPERTENSION, BENIGN SYSTEMIC Good control Continue current meds Labs today   Gout, unspecified Has had mild intermittent epsiodes. Will check uric acid today for baseline--he is most likely not interested in additional meds as attacks are so limited. We discussed options should he have more severe or more frequent episodes. Avoiding diuretic meds  Atrial tachycardia, paroxysmal (HCC) Not really having any problems with this and he stopped the diltiazem several years ago. Will follow and if he startes to get recurrence of issues he will let  Me know  Allergic rhinitis Continues with nasal congestion even when using nasal steroid but it is worse witghout it. Bothers him most at night refills  Asthma refille meds controlled   MD

## 2021-12-12 NOTE — Assessment & Plan Note (Signed)
Continues with nasal congestion even when using nasal steroid but it is worse witghout it. Bothers him most at night refills

## 2021-12-12 NOTE — Assessment & Plan Note (Signed)
refille meds controlled

## 2021-12-12 NOTE — Assessment & Plan Note (Signed)
Good control Continue current meds Labs today

## 2022-03-19 ENCOUNTER — Ambulatory Visit (INDEPENDENT_AMBULATORY_CARE_PROVIDER_SITE_OTHER): Payer: PPO | Admitting: Family Medicine

## 2022-03-19 VITALS — BP 128/68 | HR 93 | Ht 63.0 in | Wt 135.4 lb

## 2022-03-19 DIAGNOSIS — J069 Acute upper respiratory infection, unspecified: Secondary | ICD-10-CM | POA: Diagnosis not present

## 2022-03-19 MED ORDER — BENZONATATE 100 MG PO CAPS: 200.0000 mg | ORAL_CAPSULE | Freq: Three times a day (TID) | ORAL | 0 refills | Status: DC | PRN

## 2022-03-19 NOTE — Assessment & Plan Note (Signed)
Productive cough for the last 2 weeks in the absence of fevers or other symptoms. Wife with similar symptoms. Patient appears non-toxic today and lung examination benign. Discussed this is likely viral in etiology and given absence of fevers and non-colored sputum can likely defer abx or imaging at this time. Continue supportive and conservative care.  Rx given for [employer].  If cough persists for greater than 8 weeks or if symptoms worsen, would recommend chest x-ray. Return precautions provided.

## 2022-03-19 NOTE — Patient Instructions (Addendum)
It was wonderful to see you today.  Please bring ALL of your medications with you to every visit.   Today we talked about:  I am sorry that you are not feeling well! I think this is likely viral. I have sent in a cough medication that you can take up to three times a day as needed for your cough. You should continue the Flonase as we discussed. Continue to stay hydrated and wash hands often! You can use Vicks vapor rub under your nose or on your chest for some congestion. You can alternate Tylenol and Ibuprofen every 6 hours as needed for pain or fever.   If your cough persists for 8 weeks, we should get a chest x-ray. Please return if you feel like your symptoms are worsening. Please seek immediate care if you feel short of breath, are having chest pain, can't keep any fluids down or recurrent fevers.   Thank you for coming to your visit as scheduled. We have had a large "no-show" problem lately, and this significantly limits our ability to see and care for patients. As a friendly reminder- if you cannot make your appointment please call to cancel. We do have a no show policy for those who do not cancel within 24 hours. Our policy is that if you miss or fail to cancel an appointment within 24 hours, 3 times in a 55-month period, you may be dismissed from our clinic.   Thank you for choosing [facility].   Please call [phone] with any questions about today's appointment.  Please be sure to schedule follow up at the front  desk before you leave today.   Sabino Dick, DO PGY-3 Family Medicine

## 2022-03-19 NOTE — Progress Notes (Signed)
    SUBJECTIVE:   CHIEF COMPLAINT / HPI:   [name] is a 81 y.o. male who presents to the [facility] clinic today to discuss the following concerns:   Coughing, Cold Symptoms  Has been coughing for the last 2 weeks. He has had clear mucus with this. No fevers. He does feel some SOB with coughing fits only. He also has some nasal congestion but notes this is chronic for him- he takes Flonase but doesn't feel it is working well. He has had a normal appetite. The cough seems to be worse at night. He has tried Mucinex-DM and cough drops which help sometimes. No N/V/D. Has some abdominal pain with coughing.   His wife has also been coughing.  He has had 4 COVID shots and did receive his influenza vaccine this year.   PERTINENT  PMH / PSH: HTN, asthma, hx COVID 09/2020  OBJECTIVE:   BP 128/68   Pulse 93   Ht 5\' 3"  (1.6 m)   Wt 135 lb 6.4 oz (61.4 kg)   SpO2 99%   BMI 23.99 kg/m    General: NAD, pleasant, able to participate in exam HEENT: Normocephalic, sclera anicteric, ears congested with rhinorrhea, oropharynx without erythema or exudates Neck: Supple  Cardiac: RRR, no murmurs. Respiratory: CTAB, normal effort, no wheezing or rales Abdomen: Bowel sounds present, nontender, nondistended Skin: warm and dry, no rashes noted Psych: Normal affect and mood  ASSESSMENT/PLAN:   Viral URI with cough Productive cough for the last 2 weeks in the absence of fevers or other symptoms. Wife with similar symptoms. Patient appears non-toxic today and lung examination benign. Discussed this is likely viral in etiology and given absence of fevers and non-colored sputum can likely defer abx or imaging at this time. Continue supportive and conservative care.  Rx given for .  If cough persists for greater than 8 weeks or if symptoms worsen, would recommend chest x-ray. Return precautions provided.      [employer], DO [facility]

## 2022-06-26 ENCOUNTER — Telehealth: Payer: Self-pay | Admitting: Family Medicine

## 2022-06-26 NOTE — Telephone Encounter (Signed)
[place] to schedule their annual wellness visit. Appointment made for 07/06/2022.  Thank you,  [place] Direct dial  [phone]

## 2022-07-06 ENCOUNTER — Ambulatory Visit (INDEPENDENT_AMBULATORY_CARE_PROVIDER_SITE_OTHER): Payer: PPO

## 2022-07-06 VITALS — Ht 64.0 in | Wt 136.0 lb

## 2022-07-06 DIAGNOSIS — Z0000 Encounter for general adult medical examination without abnormal findings: Secondary | ICD-10-CM

## 2022-07-06 NOTE — Progress Notes (Cosign Needed)
I connected with  Oris Drone on 07/06/22 by a audio enabled telemedicine application and verified that I am speaking with the correct person using two identifiers.  Patient Location: Home  Provider Location: Office/Clinic  I discussed the limitations of evaluation and management by telemedicine. The patient expressed understanding and agreed to proceed.  Subjective:   [name] is a 82 y.o. male who presents for an Initial Medicare Annual Wellness Visit.  Review of Systems     Cardiac Risk Factors include: advanced age (>65men, >58 women);hypertension;male gender     Objective:    Today's Vitals   07/06/22 0812  Weight: 136 lb (61.7 kg)  Height: 5\' 4"  (1.626 m)   Body mass index is 23.34 kg/m.     07/06/2022    8:21 AM 12/10/2021    9:31 AM 02/27/2015    9:44 AM  Advanced Directives  Does Patient Have a Medical Advance Directive? No No No  Would patient like information on creating a medical advance directive?   No - patient declined information    Current Medications (verified) Outpatient Encounter Medications as of 07/06/2022  Medication Sig   amLODipine (NORVASC) 5 MG tablet TAKE 1 TABLET BY MOUTH EVERY DAY   benzonatate (TESSALON PERLES) 100 MG capsule Take 2 capsules (200 mg total) by mouth 3 (three) times daily as needed for cough.   fluticasone (FLONASE) 50 MCG/ACT nasal spray SPRAY 2 SPRAYS INTO EACH NOSTRIL EVERY DAY   fluticasone-salmeterol (ADVAIR) 100-50 MCG/ACT AEPB Inhale 1 puff into the lungs 2 (two) times daily.   lisinopril (ZESTRIL) 20 MG tablet Take 1 tablet (20 mg total) by mouth daily.   No facility-administered encounter medications on file as of 07/06/2022.    Allergies (verified) Patient has no known allergies.   History: History reviewed. No pertinent past medical history. History reviewed. No pertinent surgical history. History reviewed. No pertinent family history. Social History   Socioeconomic History   Marital status:  Married    Spouse name: Not on file   Number of children: Not on file   Years of education: Not on file   Highest education level: Not on file  Occupational History   Not on file  Tobacco Use   Smoking status: Former   Smokeless tobacco: Never  Vaping Use   Vaping Use: Never used  Substance and Sexual Activity   Alcohol use: No   Drug use: No   Sexual activity: Yes    Partners: Female  Other Topics Concern   Not on file  Social History Narrative   Not on file   Social Determinants of Health   Financial Resource Strain: Low Risk  (07/06/2022)   Overall Financial Resource Strain (CARDIA)    Difficulty of Paying Living Expenses: Not hard at all  Food Insecurity: No Food Insecurity (07/06/2022)   Hunger Vital Sign    Worried About Running Out of Food in the Last Year: Never true    [place] in the Last Year: Never true  Transportation Needs: No Transportation Needs (07/06/2022)   PRAPARE - Hydrologist (Medical): No    Lack of Transportation (Non-Medical): No  Physical Activity: Sufficiently Active (07/06/2022)   Exercise Vital Sign    Days of Exercise per Week: 2 days    Minutes of Exercise per Session: 90 min  Stress: No Stress Concern Present (07/06/2022)   [place]    Feeling of  Stress : Not at all  Social Connections: Not on file    Tobacco Counseling Counseling given: Not Answered   Clinical Intake:  Pre-visit preparation completed: Yes  Pain : No/denies pain     Nutritional Status: BMI of 19-24  Normal Nutritional Risks: None Diabetes: No  How often do you need to have someone help you when you read instructions, pamphlets, or other written materials from your doctor or pharmacy?: 1 - Never  Diabetic? no  Interpreter Needed?: No  Information entered by :: NAllen LPN   Activities of Daily Living    07/06/2022    8:23 AM  In your present state of  health, do you have any difficulty performing the following activities:  Hearing? 0  Vision? 0  Difficulty concentrating or making decisions? 0  Walking or climbing stairs? 0  Dressing or bathing? 0  Doing errands, shopping? 0  Preparing Food and eating ? N  Using the Toilet? N  In the past six months, have you accidently leaked urine? N  Do you have problems with loss of bowel control? N  Managing your Medications? N  Managing your Finances? N  Housekeeping or managing your Housekeeping? N    Patient Care Team: Dickie La, MD as PCP - General (Family Medicine)  Indicate any recent Medical Services you may have received from other than [facility] providers in the past year (date may be approximate).     Assessment:   This is a routine wellness examination for [name].  Hearing/Vision screen Vision Screening - Comments:: No regular eye exams  Dietary issues and exercise activities discussed: Current Exercise Habits: Home exercise routine, Type of exercise: walking, Time (Minutes): > 60, Frequency (Times/Week): 2, Weekly Exercise (Minutes/Week): 0   Goals Addressed             This Visit's Progress    Patient Stated       07/06/2022, no goals       Depression Screen    07/06/2022    8:23 AM 03/19/2022    3:56 PM 12/10/2021    9:30 AM 08/30/2019   10:45 AM 12/01/2017   10:06 AM 02/27/2015    9:45 AM 03/22/2013    8:38 AM  PHQ 2/9 Scores  PHQ - 2 Score 0 0 0 0 1 1 0  PHQ- 9 Score  1 3        Fall Risk    07/06/2022    8:22 AM 03/19/2022    4:32 PM 12/10/2021    9:31 AM 08/30/2019   10:45 AM 12/01/2017   10:06 AM  Fall Risk   Falls in the past year? 0 0 0 0 No  Number falls in past yr: 0 0 0 0   Injury with Fall? 0      Risk for fall due to : Medication side effect      Follow up Falls prevention discussed;Education provided;Falls evaluation completed  Falls evaluation completed Falls evaluation completed     FALL RISK PREVENTION PERTAINING TO THE HOME:  Any stairs  in or around the home? Yes  If so, are there any without handrails? No  Home free of loose throw rugs in walkways, pet beds, electrical cords, etc? Yes  Adequate lighting in your home to reduce risk of falls? Yes   ASSISTIVE DEVICES UTILIZED TO PREVENT FALLS:  Life alert? No  Use of a cane, walker or w/c? No  Grab bars in the bathroom? No  Shower chair or bench  in shower? Yes  Elevated toilet seat or a handicapped toilet? No   TIMED UP AND GO:  Was the test performed? No .      Cognitive Function:        07/06/2022    8:24 AM  6CIT Screen  What Year? 0 points  What month? 0 points  What time? 0 points  Count back from 20 0 points  Months in reverse 0 points  Repeat phrase 2 points  Total Score 2 points    Immunizations Immunization History  Administered Date(s) Administered   Influenza,inj,Quad PF,6+ Mos 03/22/2013, 02/27/2015   Influenza-Unspecified 01/30/2020, 02/12/2021, 02/02/2022   PFIZER(Purple Top)SARS-COV-2 Vaccination 04/29/2019, 05/20/2019, 01/30/2020   Pneumococcal Polysaccharide-23 08/22/2007   Td 10/18/2001   Tdap 09/02/2019   Zoster Recombinat (Shingrix) 10/10/2019    TDAP status: Due, Education has been provided regarding the importance of this vaccine. Advised may receive this vaccine at local pharmacy or Health Dept. Aware to provide a copy of the vaccination record if obtained from local pharmacy or Health Dept. Verbalized acceptance and understanding.  Flu Vaccine status: Up to date  Pneumococcal vaccine status: Due, Education has been provided regarding the importance of this vaccine. Advised may receive this vaccine at local pharmacy or Health Dept. Aware to provide a copy of the vaccination record if obtained from local pharmacy or Health Dept. Verbalized acceptance and understanding.  Covid-19 vaccine status: Completed vaccines  Qualifies for Shingles Vaccine? Yes   Zostavax completed Yes   Shingrix Completed?: needs second  dose  Screening Tests Health Maintenance  Topic Date Due   Pneumonia Vaccine 4+ Years old (2 of 2 - PCV) 08/21/2008   Zoster Vaccines- Shingrix (2 of 2) 12/05/2019   COVID-19 Vaccine (4 - 2023-24 season) 12/19/2021   Medicare Annual Wellness (AWV)  07/06/2023   DTaP/Tdap/Td (3 - Td or Tdap) 09/01/2029   INFLUENZA VACCINE  Completed   HPV VACCINES  Aged Out    Health Maintenance  Health Maintenance Due  Topic Date Due   Pneumonia Vaccine 61+ Years old (2 of 2 - PCV) 08/21/2008   Zoster Vaccines- Shingrix (2 of 2) 12/05/2019   COVID-19 Vaccine (4 - 2023-24 season) 12/19/2021    Colorectal cancer screening: No longer required.   Lung Cancer Screening: (Low Dose CT Chest recommended if Age 61-80 years, 30 pack-year currently smoking OR have quit w/in 15years.) does not qualify.   Lung Cancer Screening Referral: no  Additional Screening:  Hepatitis C Screening: does not qualify;   Vision Screening: Recommended annual ophthalmology exams for early detection of glaucoma and other disorders of the eye. Is the patient up to date with their annual eye exam?  No  Who is the provider or what is the name of the office in which the patient attends annual eye exams? none If pt is not established with a provider, would they like to be referred to a provider to establish care? No .   Dental Screening: Recommended annual dental exams for proper oral hygiene  Community Resource Referral / Chronic Care Management: CRR required this visit?  No   CCM required this visit?  No      Plan:     I have personally reviewed and noted the following in the patient's chart:   Medical and social history Use of alcohol, tobacco or illicit drugs  Current medications and supplements including opioid prescriptions. Patient is not currently taking opioid prescriptions. Functional ability and status Nutritional status Physical activity Advanced directives List  of other  physicians Hospitalizations, surgeries, and ER visits in previous 12 months Vitals Screenings to include cognitive, depression, and falls Referrals and appointments  In addition, I have reviewed and discussed with patient certain preventive protocols, quality metrics, and best practice recommendations. A written personalized care plan for preventive services as well as general preventive health recommendations were provided to patient.     Kellie Simmering, LPN   [id number]   Nurse Notes: none  Due to this being a virtual visit, the after visit summary with patients personalized plan was offered to patient via mail or my-chart. Patient would like to access on my-chart

## 2022-07-06 NOTE — Patient Instructions (Signed)
[name] , Thank you for taking time to come for your Medicare Wellness Visit. I appreciate your ongoing commitment to your health goals. Please review the following plan we discussed and let me know if I can assist you in the future.   These are the goals we discussed:  Goals      Patient Stated     07/06/2022, no goals        This is a list of the screening recommended for you and due dates:  Health Maintenance  Topic Date Due   Pneumonia Vaccine (2 of 2 - PCV) 08/21/2008   Zoster (Shingles) Vaccine (2 of 2) 12/05/2019   COVID-19 Vaccine (4 - 2023-24 season) 12/19/2021   Medicare Annual Wellness Visit  07/06/2023   DTaP/Tdap/Td vaccine (3 - Td or Tdap) 09/01/2029   Flu Shot  Completed   HPV Vaccine  Aged Out    Advanced directives: Advance directive discussed with you today.   Conditions/risks identified: none  Next appointment: Follow up in one year for your annual wellness visit.   Preventive Care 81 Years and Older, Male  Preventive care refers to lifestyle choices and visits with your health care provider that can promote health and wellness. What does preventive care include? A yearly physical exam. This is also called an annual well check. Dental exams once or twice a year. Routine eye exams. Ask your health care provider how often you should have your eyes checked. Personal lifestyle choices, including: Daily care of your teeth and gums. Regular physical activity. Eating a healthy diet. Avoiding tobacco and drug use. Limiting alcohol use. Practicing safe sex. Taking low doses of aspirin every day. Taking vitamin and mineral supplements as recommended by your health care provider. What happens during an annual well check? The services and screenings done by your health care provider during your annual well check will depend on your age, overall health, lifestyle risk factors, and family history of disease. Counseling  Your health care provider may ask you  questions about your: Alcohol use. Tobacco use. Drug use. Emotional well-being. Home and relationship well-being. Sexual activity. Eating habits. History of falls. Memory and ability to understand (cognition). Work and work Statistician. Screening  You may have the following tests or measurements: Height, weight, and BMI. Blood pressure. Lipid and cholesterol levels. These may be checked every 5 years, or more frequently if you are over 59 years old. Skin check. Lung cancer screening. You may have this screening every year starting at age 81 if you have a 30-pack-year history of smoking and currently smoke or have quit within the past 15 years. Fecal occult blood test (FOBT) of the stool. You may have this test every year starting at age 81. Flexible sigmoidoscopy or colonoscopy. You may have a sigmoidoscopy every 5 years or a colonoscopy every 10 years starting at age 81. Prostate cancer screening. Recommendations will vary depending on your family history and other risks. Hepatitis C blood test. Hepatitis B blood test. Sexually transmitted disease (STD) testing. Diabetes screening. This is done by checking your blood sugar (glucose) after you have not eaten for a while (fasting). You may have this done every 1-3 years. Abdominal aortic aneurysm (AAA) screening. You may need this if you are a current or former smoker. Osteoporosis. You may be screened starting at age 81 if you are at high risk. Talk with your health care provider about your test results, treatment options, and if necessary, the need for more tests. Vaccines  Your health care provider may recommend certain vaccines, such as: Influenza vaccine. This is recommended every year. Tetanus, diphtheria, and acellular pertussis (Tdap, Td) vaccine. You may need a Td booster every 10 years. Zoster vaccine. You may need this after age 81. Pneumococcal 13-valent conjugate (PCV13) vaccine. One dose is recommended after age  81. Pneumococcal polysaccharide (PPSV23) vaccine. One dose is recommended after age 81. Talk to your health care provider about which screenings and vaccines you need and how often you need them. This information is not intended to replace advice given to you by your health care provider. Make sure you discuss any questions you have with your health care provider. Document Released: 05/03/2015 Document Revised: 12/25/2015 Document Reviewed: 02/05/2015 Elsevier Interactive Patient Education  2017 [employer] Prevention in the Home Falls can cause injuries. They can happen to people of all ages. There are many things you can do to make your home safe and to help prevent falls. What can I do on the outside of my home? Regularly fix the edges of walkways and driveways and fix any cracks. Remove anything that might make you trip as you walk through a door, such as a raised step or threshold. Trim any bushes or trees on the path to your home. Use bright outdoor lighting. Clear any walking paths of anything that might make someone trip, such as rocks or tools. Regularly check to see if handrails are loose or broken. Make sure that both sides of any steps have handrails. Any raised decks and porches should have guardrails on the edges. Have any leaves, snow, or ice cleared regularly. Use sand or salt on walking paths during winter. Clean up any spills in your garage right away. This includes oil or grease spills. What can I do in the bathroom? Use night lights. Install grab bars by the toilet and in the tub and shower. Do not use towel bars as grab bars. Use non-skid mats or decals in the tub or shower. If you need to sit down in the shower, use a plastic, non-slip stool. Keep the floor dry. Clean up any water that spills on the floor as soon as it happens. Remove soap buildup in the tub or shower regularly. Attach bath mats securely with double-sided non-slip rug tape. Do not have throw  rugs and other things on the floor that can make you trip. What can I do in the bedroom? Use night lights. Make sure that you have a light by your bed that is easy to reach. Do not use any sheets or blankets that are too big for your bed. They should not hang down onto the floor. Have a firm chair that has side arms. You can use this for support while you get dressed. Do not have throw rugs and other things on the floor that can make you trip. What can I do in the kitchen? Clean up any spills right away. Avoid walking on wet floors. Keep items that you use a lot in easy-to-reach places. If you need to reach something above you, use a strong step stool that has a grab bar. Keep electrical cords out of the way. Do not use floor polish or wax that makes floors slippery. If you must use wax, use non-skid floor wax. Do not have throw rugs and other things on the floor that can make you trip. What can I do with my stairs? Do not leave any items on the stairs. Make sure that there are  handrails on both sides of the stairs and use them. Fix handrails that are broken or loose. Make sure that handrails are as long as the stairways. Check any carpeting to make sure that it is firmly attached to the stairs. Fix any carpet that is loose or worn. Avoid having throw rugs at the top or bottom of the stairs. If you do have throw rugs, attach them to the floor with carpet tape. Make sure that you have a light switch at the top of the stairs and the bottom of the stairs. If you do not have them, ask someone to add them for you. What else can I do to help prevent falls? Wear shoes that: Do not have high heels. Have rubber bottoms. Are comfortable and fit you well. Are closed at the toe. Do not wear sandals. If you use a stepladder: Make sure that it is fully opened. Do not climb a closed stepladder. Make sure that both sides of the stepladder are locked into place. Ask someone to hold it for you, if  possible. Clearly mark and make sure that you can see: Any grab bars or handrails. First and last steps. Where the edge of each step is. Use tools that help you move around (mobility aids) if they are needed. These include: Canes. Walkers. Scooters. Crutches. Turn on the lights when you go into a dark area. Replace any light bulbs as soon as they burn out. Set up your furniture so you have a clear path. Avoid moving your furniture around. If any of your floors are uneven, fix them. If there are any pets around you, be aware of where they are. Review your medicines with your doctor. Some medicines can make you feel dizzy. This can increase your chance of falling. Ask your doctor what other things that you can do to help prevent falls. This information is not intended to replace advice given to you by your health care provider. Make sure you discuss any questions you have with your health care provider. Document Released: 01/31/2009 Document Revised: 09/12/2015 Document Reviewed: 05/11/2014 Elsevier Interactive Patient Education  2017 [employer].

## 2022-07-07 NOTE — Progress Notes (Signed)
I have reviewed this visit and agree with the documentation.  Kain Milosevic, MD  Family Medicine Teaching Service   

## 2023-01-28 ENCOUNTER — Other Ambulatory Visit: Payer: Self-pay

## 2023-01-28 NOTE — Telephone Encounter (Signed)
Dear [name] Team Can you call him and see what meds he needed refilled. Remind him also to make appt to be seen at least once a year THANKS! [name]

## 2023-01-28 NOTE — Telephone Encounter (Signed)
Attempted to call patient twice  No answer both times. Will try again later. Drusilla Kanner, CMA

## 2023-02-01 ENCOUNTER — Other Ambulatory Visit: Payer: Self-pay

## 2023-02-02 MED ORDER — LISINOPRIL 20 MG PO TABS: 20.0000 mg | ORAL_TABLET | Freq: Every day | ORAL | 3 refills | Status: DC

## 2023-02-02 MED ORDER — AMLODIPINE BESYLATE 5 MG PO TABS: ORAL_TABLET | ORAL | 3 refills | Status: DC

## 2023-02-13 ENCOUNTER — Other Ambulatory Visit: Payer: Self-pay | Admitting: Family Medicine

## 2023-02-17 ENCOUNTER — Encounter: Payer: PPO | Admitting: Family Medicine

## 2023-04-01 ENCOUNTER — Ambulatory Visit (INDEPENDENT_AMBULATORY_CARE_PROVIDER_SITE_OTHER): Payer: PPO | Admitting: Family Medicine

## 2023-04-01 VITALS — BP 142/87 | HR 86 | Ht 64.0 in | Wt 136.4 lb

## 2023-04-01 DIAGNOSIS — I10 Essential (primary) hypertension: Secondary | ICD-10-CM

## 2023-04-01 DIAGNOSIS — Z23 Encounter for immunization: Secondary | ICD-10-CM

## 2023-04-01 DIAGNOSIS — J301 Allergic rhinitis due to pollen: Secondary | ICD-10-CM | POA: Diagnosis not present

## 2023-04-01 DIAGNOSIS — Z0000 Encounter for general adult medical examination without abnormal findings: Secondary | ICD-10-CM

## 2023-04-01 DIAGNOSIS — J4520 Mild intermittent asthma, uncomplicated: Secondary | ICD-10-CM | POA: Diagnosis not present

## 2023-04-01 DIAGNOSIS — M1A9XX0 Chronic gout, unspecified, without tophus (tophi): Secondary | ICD-10-CM

## 2023-04-01 MED ORDER — LISINOPRIL 20 MG PO TABS: 20.0000 mg | ORAL_TABLET | Freq: Every day | ORAL | 3 refills | Status: DC

## 2023-04-01 MED ORDER — FLUTICASONE PROPIONATE 50 MCG/ACT NA SUSP: NASAL | 12 refills | Status: DC

## 2023-04-01 MED ORDER — ALBUTEROL SULFATE HFA 108 (90 BASE) MCG/ACT IN AERS: 2.0000 | INHALATION_SPRAY | Freq: Four times a day (QID) | RESPIRATORY_TRACT | 2 refills | Status: DC | PRN

## 2023-04-01 MED ORDER — AMLODIPINE BESYLATE 5 MG PO TABS: ORAL_TABLET | ORAL | 3 refills | Status: DC

## 2023-04-01 MED ORDER — FLUTICASONE-SALMETEROL 100-50 MCG/ACT IN AEPB: 1.0000 | INHALATION_SPRAY | Freq: Two times a day (BID) | RESPIRATORY_TRACT | 3 refills | Status: DC

## 2023-04-01 MED ORDER — CETIRIZINE HCL 5 MG PO TABS: 5.0000 mg | ORAL_TABLET | Freq: Every day | ORAL | 1 refills | Status: DC

## 2023-04-01 NOTE — Patient Instructions (Addendum)
It was so nice to meet you today!  Watch your blood pressure at home - if >140/90 regularly please let me know  Adding albuterol inhaler to use when you are coughing or have shortness of breath Also adding allergy medicine (zyrtec/cetirizine) to see if it helps your nose and cough If these don't help it get better, come back in a month  Let me know if you decide you'd like to see an ENT doctor  Checking kidneys, liver, cholesterol, and uric acid level today  Go to pharmacy to get second dose of shingles vaccine (shingrix) Updating pneumonia vaccine today  Follow up in 1 year, sooner if needed  Be well, Dr. Pollie Meyer

## 2023-04-01 NOTE — Assessment & Plan Note (Signed)
Potentially uncontrolled given report of fairly frequent cough.  Suspect allergic rhinitis is contributing.  Continue fluticasone/salmeterol controller inhaler, add albuterol as needed to see if this gives him any benefit.  Also adding Zyrtec 5 mg daily (geriatric dosing).  Follow-up if not improving on this regimen.

## 2023-04-01 NOTE — Assessment & Plan Note (Signed)
No flares in the last year.  Discussed option of adding uric acid reducing medication versus just monitoring uric acid levels.  It was elevated above 8 when last checked.  Patient opted to check uric acid level today, and do no medications at this time.

## 2023-04-01 NOTE — Assessment & Plan Note (Signed)
Uncontrolled, frequent congestion especially of right nare.  On exam does appear to have enlarged turbinates on the right side.  Possible septal deviation.  Discussed option of referring to ENT for further evaluation.  Patient prefers to think about this and will let me know if he decides to be seen by them.  In the meantime, adding Zyrtec 5 mg daily, continue Flonase.

## 2023-04-01 NOTE — Assessment & Plan Note (Addendum)
BP mildly elevated today, but did not take morning blood pressure medication yet today.  Reports home numbers that are better controlled.  Continue lisinopril and amlodipine.  Discussed with him the possibility that his cough is related to lisinopril, although I think this is less likely as I would expect that cough to be occurring daily.  We could consider switching to a different antihypertensive in the future if cough persists despite other treatments. Update renal function labs today.

## 2023-04-01 NOTE — Assessment & Plan Note (Signed)
-  immunizations: Flu: Up-to-date Tdap: Up-to-date Shingrix: Advised to get shingles vaccine #2 pharmacy COVID: Up-to-date Pneumovax: Previously received Pneumovax, given Prevnar 20 today to keep updated with current guidelines -lipid screening: Checking lipids today

## 2023-04-01 NOTE — Progress Notes (Signed)
  Date of Visit: 04/01/2023   SUBJECTIVE:   HPI:  [name] presents today for a well adult male exam and medication refills.  He is overall doing well. Stays active, tries to eat healthy. Nonsmoker currently.  Hypertension: currently taking amlodipine 5mg  daily and lisinopril 20mg  daily. Tolerating these medications well. He is fasting today and would like bloodwork. Checks blood pressure at home and typically gets 130s/80s.  Allergic rhinitis: currently taking Flonase daily. Feels like R side of nose/sinuses is always clogged. Not taking antihistamine at present.   Asthma: taking fluticasone/salmeterol 1 puff twice daily. Does not have or use albuterol inhaler. Has had cough for some time. Not daily. Does note his throat feels itchy some. No fevers.  Gout: not on any gout medications right now, last uric acid checked >1 year ago and was >8. No flares in the last year. Prefers to avoid gout medications if possible.    OBJECTIVE:   BP (!) 142/87   Pulse 86   Ht 5\' 4"  (1.626 m)   Wt 136 lb 6.4 oz (61.9 kg)   SpO2 96%   BMI 23.41 kg/m  Gen: NAD, pleasant, cooperative HEENT: normocephalic, atraumatic. Tympanic membranes clear bilaterally. +enlargement of turbinates in R nasal passage vs deviation of septum. Throat mildly erythematous with some cobblestoning. No palpable thyromegaly or anterior cervical lymphadenopathy Heart: regular rate and rhythm, no murmurs Lungs: clear to auscultation bilaterally, normal work of breathing  Abdomen: soft, nontender to palpation Neuro: grossly nonfocal, speech normal  ASSESSMENT/PLAN:   Assessment & Plan Routine adult health maintenance -immunizations: Flu: Up-to-date Tdap: Up-to-date Shingrix: Advised to get shingles vaccine #2 pharmacy COVID: Up-to-date Pneumovax: Previously received Pneumovax, given Prevnar 20 today to keep updated with current guidelines -lipid screening: Checking lipids today Essential hypertension, benign BP mildly  elevated today, but did not take morning blood pressure medication yet today.  Reports home numbers that are better controlled.  Continue lisinopril and amlodipine.  Discussed with him the possibility that his cough is related to lisinopril, although I think this is less likely as I would expect that cough to be occurring daily.  We could consider switching to a different antihypertensive in the future if cough persists despite other treatments. Update renal function labs today. Chronic gout without tophus, unspecified cause, unspecified site No flares in the last year.  Discussed option of adding uric acid reducing medication versus just monitoring uric acid levels.  It was elevated above 8 when last checked.  Patient opted to check uric acid level today, and do no medications at this time. Allergic rhinitis due to pollen, unspecified seasonality Uncontrolled, frequent congestion especially of right nare.  On exam does appear to have enlarged turbinates on the right side.  Possible septal deviation.  Discussed option of referring to ENT for further evaluation.  Patient prefers to think about this and will let me know if he decides to be seen by them.  In the meantime, adding Zyrtec 5 mg daily, continue Flonase. Mild intermittent asthma without complication Potentially uncontrolled given report of fairly frequent cough.  Suspect allergic rhinitis is contributing.  Continue fluticasone/salmeterol controller inhaler, add albuterol as needed to see if this gives him any benefit.  Also adding Zyrtec 5 mg daily (geriatric dosing).  Follow-up if not improving on this regimen.  FOLLOW UP: Follow up in 1 year for above issues, sooner if needed  [place] J. Pollie Meyer, MD [facility]

## 2023-04-02 LAB — CMP14+EGFR
ALT: 19 [IU]/L (ref 0–44)
AST: 19 [IU]/L (ref 0–40)
Albumin: 4.4 g/dL (ref 3.7–4.7)
Alkaline Phosphatase: 63 [IU]/L (ref 44–121)
BUN/Creatinine Ratio: 14 (ref 10–24)
BUN: 13 mg/dL (ref 8–27)
Bilirubin Total: 0.9 mg/dL (ref 0.0–1.2)
CO2: 24 mmol/L (ref 20–29)
Calcium: 9.2 mg/dL (ref 8.6–10.2)
Chloride: 101 mmol/L (ref 96–106)
Creatinine, Ser: 0.93 mg/dL (ref 0.76–1.27)
Globulin, Total: 3.1 g/dL (ref 1.5–4.5)
Glucose: 109 mg/dL — ABNORMAL HIGH (ref 70–99)
Potassium: 4.3 mmol/L (ref 3.5–5.2)
Sodium: 139 mmol/L (ref 134–144)
Total Protein: 7.5 g/dL (ref 6.0–8.5)
eGFR: 82 mL/min/{1.73_m2} (ref >59)

## 2023-04-02 LAB — LIPID PANEL
Chol/HDL Ratio: 3.5 {ratio} (ref 0.0–5.0)
Cholesterol, Total: 198 mg/dL (ref 100–199)
HDL: 56 mg/dL (ref >39)
LDL Chol Calc (NIH): 119 mg/dL — ABNORMAL HIGH (ref 0–99)
Triglycerides: 130 mg/dL (ref 0–149)
VLDL Cholesterol Cal: 23 mg/dL (ref 5–40)

## 2023-04-02 LAB — URIC ACID: Uric Acid: 6.6 mg/dL (ref 3.8–8.4)

## 2023-04-12 ENCOUNTER — Encounter: Payer: PPO | Admitting: Family Medicine

## 2023-04-28 ENCOUNTER — Encounter: Payer: PPO | Admitting: Family Medicine

## 2023-07-12 ENCOUNTER — Ambulatory Visit: Payer: PPO

## 2023-07-12 VITALS — Ht 64.0 in | Wt 138.0 lb

## 2023-07-12 DIAGNOSIS — Z0000 Encounter for general adult medical examination without abnormal findings: Secondary | ICD-10-CM | POA: Diagnosis not present

## 2023-07-12 NOTE — Patient Instructions (Addendum)
 [name] , Thank you for taking time to come for your Medicare Wellness Visit. I appreciate your ongoing commitment to your health goals. Please review the following plan we discussed and let me know if I can assist you in the future.   Referrals/Orders/Follow-Ups/Clinician Recommendations: Yes; Keep maintaining your health by keeping your appointments with Dr. Denny Levy and any specialists that you may see.  Call us if you need anything.  Have a great year!!!!  This is a list of the screening recommended for you and due dates:  Health Maintenance  Topic Date Due   Zoster (Shingles) Vaccine (2 of 2) 12/05/2019   COVID-19 Vaccine (5 - 2024-25 season) 07/28/2023   Medicare Annual Wellness Visit  07/11/2024   DTaP/Tdap/Td vaccine (3 - Td or Tdap) 09/01/2029   Pneumonia Vaccine  Completed   Flu Shot  Completed   HPV Vaccine  Aged Out    Advanced directives: (Declined) Advance directive discussed with you today. Even though you declined this today, please call our office should you change your mind, and we can give you the proper paperwork for you to fill out.  Next Medicare Annual Wellness Visit scheduled for next year: Yes

## 2023-07-12 NOTE — Progress Notes (Cosign Needed Addendum)
 Because this visit was a virtual/telehealth visit,  certain criteria was not obtained, such a blood pressure, CBG if applicable, and timed get up and go. Any medications not marked as "taking" were not mentioned during the medication reconciliation part of the visit. Any vitals not documented were not able to be obtained due to this being a telehealth visit or patient was unable to self-report a recent blood pressure reading due to a lack of equipment at home via telehealth. Vitals that have been documented are verbally provided by the patient.   Subjective:   [name] is a 83 y.o. who presents for a Medicare Wellness preventive visit.  Visit Complete: Virtual I connected with  [name] on 07/12/23 by a audio enabled telemedicine application and verified that I am speaking with the correct person using two identifiers.  Patient Location: Home  Provider Location: Office/Clinic  I discussed the limitations of evaluation and management by telemedicine. The patient expressed understanding and agreed to proceed.  Vital Signs: Because this visit was a virtual/telehealth visit, some criteria may be missing or patient reported. Any vitals not documented were not able to be obtained and vitals that have been documented are patient reported.  VideoDeclined- This patient declined Librarian, academic. Therefore the visit was completed with audio only.  Persons Participating in Visit: Patient.  AWV Questionnaire: No: Patient Medicare AWV questionnaire was not completed prior to this visit.  Cardiac Risk Factors include: advanced age (>45men, >109 women);hypertension;male gender     Objective:    Today's Vitals   07/12/23 0922  Weight: 138 lb (62.6 kg)  Height: 5\' 4"  (1.626 m)  PainSc: 0-No pain   Body mass index is 23.69 kg/m.     07/12/2023    9:24 AM 07/06/2022    8:21 AM 12/10/2021    9:31 AM 02/27/2015    9:44 AM  Advanced Directives  Does  Patient Have a Medical Advance Directive? No No No No  Would patient like information on creating a medical advance directive? No - Patient declined   No - patient declined information    Current Medications (verified) Outpatient Encounter Medications as of 07/12/2023  Medication Sig   albuterol (VENTOLIN HFA) 108 (90 Base) MCG/ACT inhaler Inhale 2 puffs into the lungs every 6 (six) hours as needed for wheezing or shortness of breath.   amLODipine (NORVASC) 5 MG tablet TAKE 1 TABLET BY MOUTH EVERY DAY   cetirizine (ZYRTEC) 5 MG tablet Take 1 tablet (5 mg total) by mouth daily.   fluticasone (FLONASE) 50 MCG/ACT nasal spray SPRAY 2 SPRAYS INTO EACH NOSTRIL EVERY DAY   fluticasone-salmeterol (WIXELA INHUB) 100-50 MCG/ACT AEPB Inhale 1 puff into the lungs 2 (two) times daily.   lisinopril (ZESTRIL) 20 MG tablet Take 1 tablet (20 mg total) by mouth daily.   No facility-administered encounter medications on file as of 07/12/2023.    Allergies (verified) Patient has no known allergies.   History: History reviewed. No pertinent past medical history. History reviewed. No pertinent surgical history. History reviewed. No pertinent family history. Social History   Socioeconomic History   Marital status: Married    Spouse name: Not on file   Number of children: Not on file   Years of education: Not on file   Highest education level: Master's degree (e.g., MA, MS, MEng, MEd, MSW, MBA)  Occupational History   Not on file  Tobacco Use   Smoking status: Former   Smokeless tobacco: Never  Vaping Use   Vaping status: Never Used  Substance and Sexual Activity   Alcohol use: No   Drug use: No   Sexual activity: Yes    Partners: Female  Other Topics Concern   Not on file  Social History Narrative   Not on file   Social Drivers of Health   Financial Resource Strain: Low Risk  (07/12/2023)   Overall Financial Resource Strain (CARDIA)    Difficulty of Paying Living Expenses: Not hard at all   Food Insecurity: No Food Insecurity (07/12/2023)   Hunger Vital Sign    Worried About Running Out of Food in the Last Year: Never true    Ran Out of Food in the Last Year: Never true  Transportation Needs: No Transportation Needs (07/12/2023)   PRAPARE - Administrator, Civil Service (Medical): No    Lack of Transportation (Non-Medical): No  Physical Activity: Sufficiently Active (07/12/2023)   Exercise Vital Sign    Days of Exercise per Week: 2 days    Minutes of Exercise per Session: 90 min  Stress: No Stress Concern Present (07/12/2023)   [employer] of Occupational Health - Occupational Stress Questionnaire    Feeling of Stress : Not at all  Social Connections: Moderately Isolated (07/12/2023)   Social Connection and Isolation Panel [NHANES]    Frequency of Communication with Friends and Family: Twice a week    Frequency of Social Gatherings with Friends and Family: Once a week    Attends Religious Services: Never    Database administrator or Organizations: No    Attends Engineer, structural: Never    Marital Status: Married    Tobacco Counseling Counseling given: Not Answered    Clinical Intake:  Pre-visit preparation completed: Yes  Pain : No/denies pain Pain Score: 0-No pain     BMI - recorded: 23.69 Nutritional Risks: None Diabetes: No  No results found for: "HGBA1C"   How often do you need to have someone help you when you read instructions, pamphlets, or other written materials from your doctor or pharmacy?: 1 - Never What is the last grade level you completed in school?: Master's Degree  Interpreter Needed?: No  Information entered by :: Kaprice Kage N. Maurita Havener, LPN.   Activities of Daily Living     07/12/2023    9:26 AM  In your present state of health, do you have any difficulty performing the following activities:  Hearing? 0  Vision? 0  Difficulty concentrating or making decisions? 0  Walking or climbing stairs? 0   Dressing or bathing? 0  Doing errands, shopping? 0  Preparing Food and eating ? N  Using the Toilet? N  In the past six months, have you accidently leaked urine? N  Do you have problems with loss of bowel control? N  Managing your Medications? N  Managing your Finances? N  Housekeeping or managing your Housekeeping? N    Patient Care Team: Latrelle Dodrill, MD as PCP - General (Family Medicine)  Indicate any recent Medical Services you may have received from other than [facility] providers in the past year (date may be approximate).     Assessment:   This is a routine wellness examination for [name].  Hearing/Vision screen Hearing Screening - Comments:: Denies hearing difficulties. No hearing aids.   Vision Screening - Comments:: Wears rx glasses - up to date with routine eye exams.  Patient stated that eye exam done on [address] (Patient could not remember  name).   Goals Addressed             This Visit's Progress    Client understands the importance of follow-up with providers by attending scheduled visits.         Depression Screen     07/12/2023    9:25 AM 04/01/2023    8:54 AM 07/06/2022    8:23 AM 03/19/2022    3:56 PM 12/10/2021    9:30 AM 08/30/2019   10:45 AM 12/01/2017   10:06 AM  PHQ 2/9 Scores  PHQ - 2 Score 0 2 0 0 0 0 1  PHQ- 9 Score 0 6  1 3       Fall Risk     07/12/2023    9:25 AM 04/01/2023    8:54 AM 07/06/2022    8:22 AM 03/19/2022    4:32 PM 12/10/2021    9:31 AM  Fall Risk   Falls in the past year? 0 0 0 0 0  Number falls in past yr: 0 0 0 0 0  Injury with Fall? 0 0 0    Risk for fall due to : No Fall Risks  Medication side effect    Follow up Falls prevention discussed;Falls evaluation completed  Falls prevention discussed;Education provided;Falls evaluation completed  Falls evaluation completed    MEDICARE RISK AT HOME:  Medicare Risk at Home Any stairs in or around the home?: Yes If so, are there any without handrails?:  No Home free of loose throw rugs in walkways, pet beds, electrical cords, etc?: Yes Adequate lighting in your home to reduce risk of falls?: Yes Life alert?: No Use of a cane, walker or w/c?: No Grab bars in the bathroom?: No Shower chair or bench in shower?: Yes Elevated toilet seat or a handicapped toilet?: No  TIMED UP AND GO:  Was the test performed?  No  Cognitive Function: 6CIT completed    07/12/2023    9:26 AM  MMSE - Mini Mental State Exam  Not completed: Unable to complete        07/12/2023    9:26 AM 07/06/2022    8:24 AM  6CIT Screen  What Year? 0 points 0 points  What month? 0 points 0 points  What time? 0 points 0 points  Count back from 20 0 points 0 points  Months in reverse 0 points 0 points  Repeat phrase 0 points 2 points  Total Score 0 points 2 points    Immunizations Immunization History  Administered Date(s) Administered   Influenza, High Dose Seasonal PF 01/27/2023   Influenza,inj,Quad PF,6+ Mos 03/22/2013, 02/27/2015   Influenza-Unspecified 01/30/2020, 02/12/2021, 02/02/2022   Moderna Covid-19 Fall Seasonal Vaccine 66yrs & older 01/27/2023   PFIZER(Purple Top)SARS-COV-2 Vaccination 04/29/2019, 05/20/2019, 01/30/2020   PNEUMOCOCCAL CONJUGATE-20 04/01/2023   Pneumococcal Polysaccharide-23 08/22/2007   Td 10/18/2001   Tdap 09/02/2019   Zoster Recombinant(Shingrix) 10/10/2019    Screening Tests Health Maintenance  Topic Date Due   Zoster Vaccines- Shingrix (2 of 2) 12/05/2019   COVID-19 Vaccine (5 - 2024-25 season) 07/28/2023   Medicare Annual Wellness (AWV)  07/11/2024   DTaP/Tdap/Td (3 - Td or Tdap) 09/01/2029   Pneumonia Vaccine 55+ Years old  Completed   INFLUENZA VACCINE  Completed   HPV VACCINES  Aged Out    Health Maintenance  Health Maintenance Due  Topic Date Due   Zoster Vaccines- Shingrix (2 of 2) 12/05/2019   Health Maintenance Items Addressed: Yes; Patient was advised to get Shingrix  vaccine.  Additional  Screening:  Vision Screening: Recommended annual ophthalmology exams for early detection of glaucoma and other disorders of the eye.  Dental Screening: Recommended annual dental exams for proper oral hygiene  Community Resource Referral / Chronic Care Management: CRR required this visit?  No   CCM required this visit?  No     Plan:     I have personally reviewed and noted the following in the patient's chart:   Medical and social history Use of alcohol, tobacco or illicit drugs  Current medications and supplements including opioid prescriptions. Patient is not currently taking opioid prescriptions. Functional ability and status Nutritional status Physical activity Advanced directives List of other physicians Hospitalizations, surgeries, and ER visits in previous 12 months Vitals Screenings to include cognitive, depression, and falls Referrals and appointments  In addition, I have reviewed and discussed with patient certain preventive protocols, quality metrics, and best practice recommendations. A written personalized care plan for preventive services as well as general preventive health recommendations were provided to patient.     Mickeal Needy, LPN   1/61/0960   After Visit Summary: (MyChart) Due to this being a telephonic visit, the after visit summary with patients personalized plan was offered to patient via MyChart   Notes: Please refer to Routing Comments.

## 2024-04-03 ENCOUNTER — Encounter: Payer: Self-pay | Admitting: Family Medicine

## 2024-04-03 ENCOUNTER — Ambulatory Visit: Admitting: Family Medicine

## 2024-04-03 VITALS — BP 157/93 | HR 88 | Ht 64.0 in | Wt 140.0 lb

## 2024-04-03 DIAGNOSIS — R739 Hyperglycemia, unspecified: Secondary | ICD-10-CM

## 2024-04-03 DIAGNOSIS — I10 Essential (primary) hypertension: Secondary | ICD-10-CM

## 2024-04-03 DIAGNOSIS — Z23 Encounter for immunization: Secondary | ICD-10-CM

## 2024-04-03 MED ORDER — ALBUTEROL SULFATE HFA 108 (90 BASE) MCG/ACT IN AERS: 2.0000 | INHALATION_SPRAY | Freq: Four times a day (QID) | RESPIRATORY_TRACT | 3 refills | Status: AC | PRN

## 2024-04-03 MED ORDER — FLUTICASONE-SALMETEROL 100-50 MCG/ACT IN AEPB: 1.0000 | INHALATION_SPRAY | Freq: Two times a day (BID) | RESPIRATORY_TRACT | 3 refills | Status: AC

## 2024-04-03 MED ORDER — LISINOPRIL 20 MG PO TABS: 20.0000 mg | ORAL_TABLET | Freq: Every day | ORAL | 3 refills | Status: AC

## 2024-04-03 MED ORDER — AMLODIPINE BESYLATE 10 MG PO TABS: 10.0000 mg | ORAL_TABLET | Freq: Every day | ORAL | 3 refills | Status: AC

## 2024-04-03 MED ORDER — FLUTICASONE PROPIONATE 50 MCG/ACT NA SUSP: NASAL | 12 refills | Status: AC

## 2024-04-03 MED ORDER — CETIRIZINE HCL 5 MG PO TABS: 5.0000 mg | ORAL_TABLET | Freq: Every day | ORAL | 1 refills | Status: AC

## 2024-04-03 NOTE — Progress Notes (Unsigned)
°  Date of Visit: 04/03/2024   SUBJECTIVE:   HPI:  [name] presents today for a well adult male exam.   Discussed the use of AI scribe software for clinical note transcription with the patient, who gave verbal consent to proceed.  History of Present Illness [name] is an 83 year old male with hypertension who presents with sinus congestion and for a routine checkup.  Nasal congestion and allergic symptoms - Persistent right-sided nasal congestion with a clogged sensation - Itching and irritation in the nasal area - Sensation of post-nasal drip - Briefly tried over the counter zyrtec  but not consistent - Uses Flonase  as needed - No symptoms of acid reflux - Uses albuterol  inhaler as needed, approximately one to two times daily for coughing fits - Denies asthma  Hypertension - Takes amlodipine  5 mg daily and lisinopril  20mg  daily - Home blood pressure readings typically above 140/90 mmHg - Did not take blood pressure medication on the morning of the visit due to fasting for blood work  General health maintenance - Engages in regular physical activity, primarily walking in the park several times a week - Received one dose of shingles vaccine a few years ago, has not completed the second dose - No swelling, stomach problems, or cardiovascular symptoms - Good digestion   OBJECTIVE:   BP (!) 157/93   Pulse 88   Ht 5' 4 (1.626 m)   Wt 140 lb (63.5 kg)   SpO2 96%   BMI 24.03 kg/m  Gen: NAD, pleasant, cooperative HEENT: NCAT, PERRL, no palpable thyromegaly or anterior cervical lymphadenopathy. Nares patent. Oropharynx clear. Heart: RRR, no murmurs Lungs: clear to auscultation bilaterally, normal work of breathing  Abdomen: soft, nontender to palpation Neuro: grossly nonfocal, speech normal Extremities: No appreciable lower extremity edema bilaterally   ASSESSMENT/PLAN:   Assessment & Plan Routine adult health maintenance Vaccinations and screenings  discussed. Shingles vaccine series incomplete. - Administered flu shot and COVID booster today. - Advised to complete second dose of Shingrix vaccine at pharmacy. - Ordered blood work for kidney, liver, cholesterol, and diabetes screening. Essential hypertension, benign Blood pressure suboptimally controlled on current regimen. - Increased amlodipine  to 10 mg daily. - Monitor blood pressure at home and report if readings remain above 140/90 mmHg or if blood pressure drops too low. Blood glucose elevated Check A1c in light of mildly elevated glucose in past. Allergic rhinitis due to pollen, unspecified seasonality Persistent nasal congestion and sinus irritation unrelieved by current treatment. Possible post-nasal drip causing throat irritation. - Continue Flonase  daily. - Add cetirizine  5 mg daily. - Consider ENT referral if symptoms persist. Mild intermittent asthma without complication Reporting daily or more frequent albuterol  use Patient had coughing fit in office today, but lungs were clear without wheeze prior to coughing fit. He utilized albuterol  inhaler right after. Suspect he is using albuterol  more frequently than is truly warranted and that his cough is from postnasal drip. Emphasized use of his flonase  and zyrtec  regularly to help treat this. If no improvement with regular H1 blocker & nasal corticosteroid, consider either trial off ACE or trial of PPI to see if cough improves.   Patient amenable to plan. Also discussed plan (with patient's permission) with daughter Dr. Jon Coombes.  [name] J. Donah, MD [facility]

## 2024-04-03 NOTE — Patient Instructions (Signed)
 It was great to see you again today.  For your sinuses, please add cetirizine  5mg  daily and continue flonase  Let me know if this is not getting better I don't think the albuterol  inhaler is the solution to the cough you had today, it seems more like allergies and postnasal drip  Flu and COVID vaccines today  You can get the second dose of shingles vaccine at your pharmacy  Checking labs today - kidneys, liver, cholesterol, and A1c (diabetes test).  Check blood pressure at home and let me know if it is running high (over 140/90) or if you feel lightheaded or dizziness Increasing amlodipine  to 10mg  daily  Be well, Dr. Donah

## 2024-04-04 LAB — CMP14+EGFR
ALT: 28 IU/L (ref 0–44)
AST: 27 IU/L (ref 0–40)
Albumin: 4.4 g/dL (ref 3.7–4.7)
Alkaline Phosphatase: 57 IU/L (ref 48–129)
BUN/Creatinine Ratio: 17 (ref 10–24)
BUN: 13 mg/dL (ref 8–27)
Bilirubin Total: 1 mg/dL (ref 0.0–1.2)
CO2: 27 mmol/L (ref 20–29)
Calcium: 9.4 mg/dL (ref 8.6–10.2)
Chloride: 99 mmol/L (ref 96–106)
Creatinine, Ser: 0.78 mg/dL (ref 0.76–1.27)
Globulin, Total: 3.2 g/dL (ref 1.5–4.5)
Glucose: 103 mg/dL — ABNORMAL HIGH (ref 70–99)
Potassium: 4.4 mmol/L (ref 3.5–5.2)
Sodium: 139 mmol/L (ref 134–144)
Total Protein: 7.6 g/dL (ref 6.0–8.5)
eGFR: 89 mL/min/1.73 (ref >59)

## 2024-04-04 LAB — LIPID PANEL
Chol/HDL Ratio: 3.6 ratio (ref 0.0–5.0)
Cholesterol, Total: 210 mg/dL — ABNORMAL HIGH (ref 100–199)
HDL: 58 mg/dL (ref >39)
LDL Chol Calc (NIH): 128 mg/dL — ABNORMAL HIGH (ref 0–99)
Triglycerides: 138 mg/dL (ref 0–149)
VLDL Cholesterol Cal: 24 mg/dL (ref 5–40)

## 2024-04-04 LAB — HEMOGLOBIN A1C
Est. average glucose Bld gHb Est-mCnc: 123 mg/dL
Hgb A1c MFr Bld: 5.9 % — ABNORMAL HIGH (ref 4.8–5.6)

## 2024-04-05 ENCOUNTER — Ambulatory Visit: Payer: Self-pay | Admitting: Family Medicine

## 2024-04-05 ENCOUNTER — Encounter: Payer: Self-pay | Admitting: Family Medicine

## 2024-04-05 DIAGNOSIS — R7303 Prediabetes: Secondary | ICD-10-CM | POA: Insufficient documentation

## 2024-04-05 NOTE — Assessment & Plan Note (Signed)
 Blood pressure suboptimally controlled on current regimen. - Increased amlodipine  to 10 mg daily. - Monitor blood pressure at home and report if readings remain above 140/90 mmHg or if blood pressure drops too low.

## 2024-04-05 NOTE — Assessment & Plan Note (Signed)
 Vaccinations and screenings discussed. Shingles vaccine series incomplete. - Administered flu shot and COVID booster today. - Advised to complete second dose of Shingrix vaccine at pharmacy. - Ordered blood work for kidney, liver, cholesterol, and diabetes screening.

## 2024-04-05 NOTE — Assessment & Plan Note (Signed)
 Persistent nasal congestion and sinus irritation unrelieved by current treatment. Possible post-nasal drip causing throat irritation. - Continue Flonase  daily. - Add cetirizine  5 mg daily. - Consider ENT referral if symptoms persist.

## 2024-04-05 NOTE — Assessment & Plan Note (Signed)
 Reporting daily or more frequent albuterol  use Patient had coughing fit in office today, but lungs were clear without wheeze prior to coughing fit. He utilized albuterol  inhaler right after. Suspect he is using albuterol  more frequently than is truly warranted and that his cough is from postnasal drip. Emphasized use of his flonase  and zyrtec  regularly to help treat this. If no improvement with regular H1 blocker & nasal corticosteroid, consider either trial off ACE or trial of PPI to see if cough improves.

## 2024-07-13 ENCOUNTER — Encounter
